# Patient Record
Sex: Female | Born: 1960 | Race: White | Hispanic: Yes | Smoking: Never smoker
Health system: Southern US, Community
[De-identification: ages and names within clinical notes are randomized; demographics above are authoritative.]

## PROBLEM LIST (undated history)

## (undated) DIAGNOSIS — I509 Heart failure, unspecified: Secondary | ICD-10-CM

## (undated) DIAGNOSIS — F419 Anxiety disorder, unspecified: Secondary | ICD-10-CM

## (undated) DIAGNOSIS — K8050 Calculus of bile duct without cholangitis or cholecystitis without obstruction: Secondary | ICD-10-CM

## (undated) DIAGNOSIS — I10 Essential (primary) hypertension: Secondary | ICD-10-CM

## (undated) HISTORY — PX: TUBAL LIGATION: SHX77

---

## 2006-07-22 ENCOUNTER — Encounter (INDEPENDENT_AMBULATORY_CARE_PROVIDER_SITE_OTHER): Payer: Self-pay | Admitting: Nurse Practitioner

## 2007-01-16 ENCOUNTER — Ambulatory Visit: Payer: Self-pay | Admitting: Family Medicine

## 2007-01-27 ENCOUNTER — Encounter: Payer: Self-pay | Admitting: Nurse Practitioner

## 2007-01-27 DIAGNOSIS — F329 Major depressive disorder, single episode, unspecified: Secondary | ICD-10-CM | POA: Insufficient documentation

## 2007-01-28 DIAGNOSIS — M1710 Unilateral primary osteoarthritis, unspecified knee: Secondary | ICD-10-CM | POA: Insufficient documentation

## 2007-01-28 DIAGNOSIS — IMO0002 Reserved for concepts with insufficient information to code with codable children: Secondary | ICD-10-CM

## 2007-01-28 DIAGNOSIS — G5600 Carpal tunnel syndrome, unspecified upper limb: Secondary | ICD-10-CM | POA: Insufficient documentation

## 2007-01-30 ENCOUNTER — Telehealth (INDEPENDENT_AMBULATORY_CARE_PROVIDER_SITE_OTHER): Payer: Self-pay | Admitting: *Deleted

## 2007-01-31 ENCOUNTER — Encounter (INDEPENDENT_AMBULATORY_CARE_PROVIDER_SITE_OTHER): Payer: Self-pay | Admitting: Nurse Practitioner

## 2007-01-31 ENCOUNTER — Ambulatory Visit: Payer: Self-pay | Admitting: Family Medicine

## 2007-01-31 LAB — CONVERTED CEMR LAB
ALT: 18 units/L (ref 0–35)
AST: 19 units/L (ref 0–37)
BUN: 18 mg/dL (ref 6–23)
Basophils Relative: 0 % (ref 0–1)
Chloride: 103 meq/L (ref 96–112)
Cholesterol: 195 mg/dL (ref 0–200)
Glucose, Bld: 74 mg/dL (ref 70–99)
HCT: 43.2 % (ref 36.0–46.0)
HDL: 37 mg/dL — ABNORMAL LOW (ref >39)
Hemoglobin: 14 g/dL (ref 12.0–15.0)
LDL Cholesterol: 123 mg/dL — ABNORMAL HIGH (ref 0–99)
Monocytes Absolute: 0.4 10*3/uL (ref 0.2–0.7)
Neutro Abs: 3.9 10*3/uL (ref 1.7–7.7)
Neutrophils Relative %: 58 % (ref 43–77)
Potassium: 4.2 meq/L (ref 3.5–5.3)
RBC: 4.84 M/uL (ref 3.87–5.11)
RDW: 13.4 % (ref 11.5–14.0)
Sodium: 138 meq/L (ref 135–145)
TSH: 3.627 microintl units/mL (ref 0.350–5.50)
Total Protein: 7.5 g/dL (ref 6.0–8.3)
Triglycerides: 176 mg/dL — ABNORMAL HIGH (ref <150)
WBC: 6.6 10*3/uL (ref 4.0–10.5)

## 2007-05-03 ENCOUNTER — Emergency Department (HOSPITAL_COMMUNITY): Admission: EM | Admit: 2007-05-03 | Discharge: 2007-05-03 | Payer: Self-pay | Admitting: *Deleted

## 2008-11-09 ENCOUNTER — Ambulatory Visit: Payer: Self-pay | Admitting: Internal Medicine

## 2008-11-09 DIAGNOSIS — R609 Edema, unspecified: Secondary | ICD-10-CM | POA: Insufficient documentation

## 2008-11-09 DIAGNOSIS — M549 Dorsalgia, unspecified: Secondary | ICD-10-CM | POA: Insufficient documentation

## 2008-11-09 DIAGNOSIS — M722 Plantar fascial fibromatosis: Secondary | ICD-10-CM | POA: Insufficient documentation

## 2008-11-09 DIAGNOSIS — I8390 Asymptomatic varicose veins of unspecified lower extremity: Secondary | ICD-10-CM | POA: Insufficient documentation

## 2008-11-09 LAB — CONVERTED CEMR LAB: CO2: 24 meq/L (ref 19–32)

## 2008-11-22 ENCOUNTER — Ambulatory Visit: Payer: Self-pay | Admitting: *Deleted

## 2008-11-25 ENCOUNTER — Encounter: Admission: RE | Admit: 2008-11-25 | Discharge: 2008-11-25 | Payer: Self-pay | Admitting: Internal Medicine

## 2008-11-26 ENCOUNTER — Encounter (INDEPENDENT_AMBULATORY_CARE_PROVIDER_SITE_OTHER): Payer: Self-pay | Admitting: Internal Medicine

## 2008-12-01 ENCOUNTER — Telehealth (INDEPENDENT_AMBULATORY_CARE_PROVIDER_SITE_OTHER): Payer: Self-pay | Admitting: Internal Medicine

## 2008-12-08 ENCOUNTER — Emergency Department (HOSPITAL_COMMUNITY): Admission: EM | Admit: 2008-12-08 | Discharge: 2008-12-08 | Payer: Self-pay | Admitting: Emergency Medicine

## 2008-12-14 ENCOUNTER — Ambulatory Visit: Payer: Self-pay | Admitting: Internal Medicine

## 2008-12-16 ENCOUNTER — Ambulatory Visit: Payer: Self-pay | Admitting: Internal Medicine

## 2008-12-16 ENCOUNTER — Encounter (INDEPENDENT_AMBULATORY_CARE_PROVIDER_SITE_OTHER): Payer: Self-pay | Admitting: Internal Medicine

## 2008-12-16 DIAGNOSIS — K625 Hemorrhage of anus and rectum: Secondary | ICD-10-CM | POA: Insufficient documentation

## 2008-12-16 LAB — CONVERTED CEMR LAB
Basophils Relative: 0 % (ref 0–1)
Bilirubin Urine: NEGATIVE
Blood in Urine, dipstick: NEGATIVE
Eosinophils Relative: 5 % (ref 0–5)
KOH Prep: NEGATIVE
Ketones, urine, test strip: NEGATIVE
MCHC: 33.6 g/dL (ref 30.0–36.0)
Neutro Abs: 4.2 10*3/uL (ref 1.7–7.7)
Neutrophils Relative %: 61 % (ref 43–77)
Nitrite: NEGATIVE
Platelets: 238 10*3/uL (ref 150–400)
Protein, U semiquant: NEGATIVE
RDW: 14.3 % (ref 11.5–15.5)
Specific Gravity, Urine: 1.02
T pallidum Antibodies (TP-PA): <0.1 (ref <1.0)
Urobilinogen, UA: 0.2
pH: 5

## 2008-12-22 ENCOUNTER — Ambulatory Visit: Payer: Self-pay | Admitting: Internal Medicine

## 2008-12-22 LAB — CONVERTED CEMR LAB
ALT: 21 units/L (ref 0–35)
AST: 21 units/L (ref 0–37)
Alkaline Phosphatase: 62 units/L (ref 39–117)
BUN: 21 mg/dL (ref 6–23)
Calcium: 8.9 mg/dL (ref 8.4–10.5)
Chloride: 106 meq/L (ref 96–112)
Cholesterol: 138 mg/dL (ref 0–200)
Glucose, Bld: 89 mg/dL (ref 70–99)
Potassium: 4.5 meq/L (ref 3.5–5.3)
Sodium: 140 meq/L (ref 135–145)
Total Bilirubin: 0.3 mg/dL (ref 0.3–1.2)
Total CHOL/HDL Ratio: 4.2
Total Protein: 6.6 g/dL (ref 6.0–8.3)
Triglycerides: 128 mg/dL (ref <150)

## 2009-01-01 ENCOUNTER — Encounter (INDEPENDENT_AMBULATORY_CARE_PROVIDER_SITE_OTHER): Payer: Self-pay | Admitting: Internal Medicine

## 2009-01-04 ENCOUNTER — Encounter: Admission: RE | Admit: 2009-01-04 | Discharge: 2009-02-15 | Payer: Self-pay | Admitting: Internal Medicine

## 2009-01-04 ENCOUNTER — Encounter (INDEPENDENT_AMBULATORY_CARE_PROVIDER_SITE_OTHER): Payer: Self-pay | Admitting: Internal Medicine

## 2009-01-06 ENCOUNTER — Encounter (INDEPENDENT_AMBULATORY_CARE_PROVIDER_SITE_OTHER): Payer: Self-pay | Admitting: Internal Medicine

## 2009-02-08 ENCOUNTER — Encounter (INDEPENDENT_AMBULATORY_CARE_PROVIDER_SITE_OTHER): Payer: Self-pay | Admitting: Internal Medicine

## 2009-08-08 ENCOUNTER — Emergency Department (HOSPITAL_COMMUNITY): Admission: EM | Admit: 2009-08-08 | Discharge: 2009-08-08 | Payer: Self-pay | Admitting: Family Medicine

## 2009-09-02 ENCOUNTER — Ambulatory Visit: Payer: Self-pay | Admitting: Internal Medicine

## 2009-09-02 DIAGNOSIS — R0789 Other chest pain: Secondary | ICD-10-CM | POA: Insufficient documentation

## 2009-09-02 LAB — CONVERTED CEMR LAB
BUN: 22 mg/dL (ref 6–23)
Basophils Absolute: 0 10*3/uL (ref 0.0–0.1)
Basophils Relative: 0 % (ref 0–1)
CO2: 24 meq/L (ref 19–32)
Calcium: 9.2 mg/dL (ref 8.4–10.5)
Cholesterol: 202 mg/dL — ABNORMAL HIGH (ref 0–200)
Eosinophils Absolute: 0.3 10*3/uL (ref 0.0–0.7)
Glucose, Bld: 92 mg/dL (ref 70–99)
HDL: 41 mg/dL (ref >39)
Hemoglobin: 14.6 g/dL (ref 12.0–15.0)
Lymphocytes Relative: 28 % (ref 12–46)
MCHC: 32.4 g/dL (ref 30.0–36.0)
MCV: 88.3 fL (ref 78.0–100.0)
Monocytes Absolute: 0.6 10*3/uL (ref 0.1–1.0)
Monocytes Relative: 9 % (ref 3–12)
Platelets: 253 10*3/uL (ref 150–400)
RBC: 5.11 M/uL (ref 3.87–5.11)
Total CHOL/HDL Ratio: 4.9
Triglycerides: 132 mg/dL (ref <150)

## 2009-09-09 ENCOUNTER — Telehealth (INDEPENDENT_AMBULATORY_CARE_PROVIDER_SITE_OTHER): Payer: Self-pay | Admitting: Internal Medicine

## 2009-09-16 ENCOUNTER — Encounter (INDEPENDENT_AMBULATORY_CARE_PROVIDER_SITE_OTHER): Payer: Self-pay | Admitting: Internal Medicine

## 2009-09-22 ENCOUNTER — Ambulatory Visit (HOSPITAL_COMMUNITY): Admission: RE | Admit: 2009-09-22 | Discharge: 2009-09-22 | Payer: Self-pay | Admitting: Interventional Cardiology

## 2009-09-22 ENCOUNTER — Encounter (INDEPENDENT_AMBULATORY_CARE_PROVIDER_SITE_OTHER): Payer: Self-pay | Admitting: Internal Medicine

## 2009-09-22 ENCOUNTER — Encounter (INDEPENDENT_AMBULATORY_CARE_PROVIDER_SITE_OTHER): Payer: Self-pay | Admitting: Interventional Cardiology

## 2009-10-06 ENCOUNTER — Encounter (HOSPITAL_COMMUNITY): Admission: RE | Admit: 2009-10-06 | Discharge: 2009-12-02 | Payer: Self-pay | Admitting: Interventional Cardiology

## 2009-10-06 ENCOUNTER — Encounter (INDEPENDENT_AMBULATORY_CARE_PROVIDER_SITE_OTHER): Payer: Self-pay | Admitting: Internal Medicine

## 2009-11-04 ENCOUNTER — Ambulatory Visit: Payer: Self-pay | Admitting: Internal Medicine

## 2009-11-15 ENCOUNTER — Ambulatory Visit (HOSPITAL_COMMUNITY): Admission: RE | Admit: 2009-11-15 | Discharge: 2009-11-15 | Payer: Self-pay | Admitting: Internal Medicine

## 2009-12-27 ENCOUNTER — Telehealth (INDEPENDENT_AMBULATORY_CARE_PROVIDER_SITE_OTHER): Payer: Self-pay | Admitting: Internal Medicine

## 2009-12-27 ENCOUNTER — Ambulatory Visit: Payer: Self-pay | Admitting: Internal Medicine

## 2009-12-27 DIAGNOSIS — E7800 Pure hypercholesterolemia, unspecified: Secondary | ICD-10-CM | POA: Insufficient documentation

## 2009-12-27 DIAGNOSIS — D485 Neoplasm of uncertain behavior of skin: Secondary | ICD-10-CM | POA: Insufficient documentation

## 2009-12-27 DIAGNOSIS — M25569 Pain in unspecified knee: Secondary | ICD-10-CM | POA: Insufficient documentation

## 2009-12-27 LAB — CONVERTED CEMR LAB
Bilirubin Urine: NEGATIVE
GC Probe Amp, Genital: NEGATIVE
Pap Smear: NEGATIVE
Urobilinogen, UA: 0.2
pH: 5

## 2009-12-28 ENCOUNTER — Encounter (INDEPENDENT_AMBULATORY_CARE_PROVIDER_SITE_OTHER): Payer: Self-pay | Admitting: Internal Medicine

## 2010-04-11 ENCOUNTER — Encounter
Admission: RE | Admit: 2010-04-11 | Discharge: 2010-04-11 | Payer: Self-pay | Source: Home / Self Care | Attending: Internal Medicine | Admitting: Internal Medicine

## 2010-04-11 ENCOUNTER — Encounter (INDEPENDENT_AMBULATORY_CARE_PROVIDER_SITE_OTHER): Payer: Self-pay | Admitting: Internal Medicine

## 2010-06-25 ENCOUNTER — Encounter: Payer: Self-pay | Admitting: Interventional Cardiology

## 2010-06-25 ENCOUNTER — Encounter: Payer: Self-pay | Admitting: Internal Medicine

## 2010-06-26 ENCOUNTER — Encounter: Payer: Self-pay | Admitting: Occupational Therapy

## 2010-07-04 NOTE — Progress Notes (Signed)
Summary: DERMATOLOGY APPT   Phone Note Outgoing Call   Summary of Call: Marcia Mccoy--referral to derm at [facility] and letter written. Initial call taken by: Julieanne Manson MD,  December 27, 2009 6:07 PM  Follow-up for Phone Call        THE NEXT AVAILABLE APPT 02-07-10 @ 5PM . PT AWARE OF THE APPT  Follow-up by: Cheryll Dessert,  December 28, 2009 10:28 AM

## 2010-07-04 NOTE — Letter (Signed)
Summary: *HSN Results Follow up  [facility]  [address] [place], [place] [address]   Phone: [phone]  Fax: [phone]      12/28/2009   [facility] [address] [place], [place]  [address]   Dear  Ms. Hopie Tuckerman,                            ____S.Drinkard,FNP   ____D. Gore,FNP       ____B. McPherson,MD   ____V. Rankins,MD    __X__E. Remi Rester,MD    ____N. Daphine Deutscher, FNP  ____D. Reche Dixon, MD    ____K. Philipp Deputy, MD    ____Other     This letter is to inform you that your recent test(s):  ____X___Pap Smear    ___X____Lab Test     _______X-ray    ____X___ is within acceptable limits  _______ requires a medication change  _______ requires a follow-up lab visit  _______ requires a follow-up visit with your provider   Comments:       _________________________________________________________ If you have any questions, please contact our office                     Sincerely,  Julieanne Manson MD [facility]

## 2010-07-04 NOTE — Letter (Signed)
Summary: NUTRITION & DIABETES MANAGEMENT  NUTRITION & DIABETES MANAGEMENT   Imported By: Arta Bruce 04/20/2010 10:54:59  _____________________________________________________________________  External Attachment:    Type:   Image     Comment:   External Document

## 2010-07-04 NOTE — Letter (Signed)
Summary: *Referral Letter  [facility]  [address] [place], [place] [address]   Phone: [phone]  Fax: [phone]    09/02/2009  Thank you in advance for agreeing to see my patient:  Marcia Mccoy [address] [place], [place]  [address]  Phone: [phone]  Reason for Referral: 50 yo female with atypical chest pain.  Mother died in early 22s of MI.  Pt. with obesity and dyslipidemia (low HDL only).  Will enclose Chest xray result, EKG, labs  Procedures Requested: ?stress testing  Current Medical Problems: 1)  CHEST PAIN, ATYPICAL (ICD-786.59) 2)  RECTAL BLEEDING (ICD-569.3) 3)  ROUTINE GYNECOLOGICAL EXAMINATION (ICD-V72.31) 4)  HEALTH MAINTENANCE EXAM (ICD-V70.0) 5)  MORBID OBESITY (ICD-278.01) 6)  PLANTAR FASCIITIS, BILATERAL (ICD-728.71) 7)  VARICOSE VEINS, LOWER EXTREMITIES (ICD-454.9) 8)  PERIPHERAL EDEMA (ICD-782.3) 9)  BACK PAIN, ACUTE (ICD-724.5) 10)  FAMILY HISTORY DIABETES 1ST DEGREE RELATIVE (ICD-V18.0) 11)  ARTHRITIS, KNEE (ICD-716.96) 12)  CARPAL TUNNEL SYNDROME (ICD-354.0) 13)  DEPRESSION (ICD-311)   Current Medications: 1)  NAPROXEN 500 MG TABS (NAPROXEN) 1 tab by mouth two times a day with food for 2 weeks, then as needed 2)  FUROSEMIDE 40 MG TABS (FUROSEMIDE) 1 tab by mouth daily in morning 3)  CALCIUM-VITAMIN D 600-200 MG-UNIT TABS (CALCIUM-VITAMIN D) 1 tab by mouth daily   Past Medical History: 1)  ROUTINE GYNECOLOGICAL EXAMINATION (ICD-V72.31) 2)  HEALTH MAINTENANCE EXAM (ICD-V70.0) 3)  MORBID OBESITY (ICD-278.01) 4)  PLANTAR FASCIITIS, BILATERAL (ICD-728.71) 5)  VARICOSE VEINS, LOWER EXTREMITIES (ICD-454.9) 6)  PERIPHERAL EDEMA (ICD-782.3) 7)  BACK PAIN, ACUTE (ICD-724.5) 8)  FAMILY HISTORY DIABETES 1ST DEGREE RELATIVE (ICD-V18.0) 9)  ARTHRITIS, KNEE (ICD-716.96) 10)  CARPAL TUNNEL SYNDROME (ICD-354.0) 11)  DEPRESSION (ICD-311)   Prior History of Blood Transfusions:   Pertinent Labs:    Thank you again for  agreeing to see our patient; please contact us if you have any further questions or need additional information.  Sincerely,  Julieanne Manson MD
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
## 2010-07-04 NOTE — Letter (Signed)
Summary: [facility]   Imported By: Arta Bruce 11/16/2009 12:22:00  _____________________________________________________________________  External Attachment:    Type:   Image     Comment:   External Document

## 2010-07-04 NOTE — Miscellaneous (Signed)
Summary: Rehab Report//DISCHARGE THERAPY  Rehab Report//DISCHARGE THERAPY   Imported By: Arta Bruce 08/01/2009 10:31:21  _____________________________________________________________________  External Attachment:    Type:   Image     Comment:   External Document

## 2010-07-04 NOTE — Letter (Signed)
Summary: *Referral Letter  [facility]  [address] [place], [place] [address]   Phone: [phone]  Fax: [phone]    12/27/2009  Thank you in advance for agreeing to see my patient:  Marcia Mccoy [address] [place], [place]  [address]  Phone: [phone]  Reason for Referral: Thickened, varaiably colored lesion above right shoulder blade.  Suspect a dermatofibroma, but would like a second opinion.  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) 2)  KNEE PAIN, LEFT (ICD-719.46) 3)  HYPERCHOLESTEROLEMIA, MILD (ICD-272.0) 4)  CHEST PAIN, ATYPICAL (ICD-786.59) 5)  RECTAL BLEEDING (ICD-569.3) 6)  ROUTINE GYNECOLOGICAL EXAMINATION (ICD-V72.31) 7)  HEALTH MAINTENANCE EXAM (ICD-V70.0) 8)  MORBID OBESITY (ICD-278.01) 9)  PLANTAR FASCIITIS, BILATERAL (ICD-728.71) 10)  VARICOSE VEINS, LOWER EXTREMITIES (ICD-454.9) 11)  PERIPHERAL EDEMA (ICD-782.3) 12)  BACK PAIN, ACUTE (ICD-724.5) 13)  FAMILY HISTORY DIABETES 1ST DEGREE RELATIVE (ICD-V18.0) 14)  ARTHRITIS, KNEE (ICD-716.96) 15)  CARPAL TUNNEL SYNDROME (ICD-354.0) 16)  DEPRESSION (ICD-311)   Current Medications: 1)  NAPROXEN 500 MG TABS (NAPROXEN) 1 tab by mouth two times a day with food for 2 weeks, then as needed 2)  FUROSEMIDE 40 MG TABS (FUROSEMIDE) 1 tab by mouth daily in morning 3)  CALCIUM-VITAMIN D 600-200 MG-UNIT TABS (CALCIUM-VITAMIN D) 1 tab by mouth daily   Past Medical History: 1)  HYPERCHOLESTEROLEMIA, MILD (ICD-272.0) 2)  CHEST PAIN, ATYPICAL (ICD-786.59) 3)  RECTAL BLEEDING (ICD-569.3) 4)  ROUTINE GYNECOLOGICAL EXAMINATION (ICD-V72.31) 5)  HEALTH MAINTENANCE EXAM (ICD-V70.0) 6)  MORBID OBESITY (ICD-278.01) 7)  PLANTAR FASCIITIS, BILATERAL (ICD-728.71) 8)  VARICOSE VEINS, LOWER EXTREMITIES (ICD-454.9) 9)  PERIPHERAL EDEMA (ICD-782.3) 10)  BACK PAIN, ACUTE (ICD-724.5) 11)  FAMILY  HISTORY DIABETES 1ST DEGREE RELATIVE (ICD-V18.0) 12)  ARTHRITIS, KNEE (ICD-716.96) 13)  CARPAL TUNNEL SYNDROME (ICD-354.0) 14)  DEPRESSION (ICD-311) 15)      Prior History of Blood Transfusions:   Pertinent Labs:    Thank you again for agreeing to see our patient; please contact us if you have any further questions or need additional information.  Sincerely,  Julieanne Manson MD

## 2010-07-04 NOTE — Progress Notes (Signed)
Summary: Refill request  Phone Note Call from Patient Call back at Home Phone [phone]   Summary of Call: The pt states that the provider supposltly needs to send a new prescription for her heart issue to the [facility]. (pt do not know the name of the medication only that is for heart issue) Isaih Bulger MD Initial call taken by: Manon Hilding,  September 09, 2009 3:49 PM  Follow-up for Phone Call        Will check to see if Dr. Delrae Alfred recalls which med this could be. Follow-up by: Vesta Mixer CMA,  September 09, 2009 4:23 PM  Additional Follow-up for Phone Call Additional follow up Details #1::        pt called via interpreter and wanted to know if meds had been called into pharmacy. she didnt know the name of the pill but she states it is for her heart. Per her description sounds like a nitrostat. Will forward to provider to review.... Additional Follow-up by: Mikey College CMA,  September 12, 2009 3:50 PM    Additional Follow-up for Phone Call Additional follow up Details #2::    It was Nitroglycerin--I have faxed--please let pt. know.  Sorry for the delay. Follow-up by: Julieanne Manson MD,  September 13, 2009 1:38 PM  Additional Follow-up for Phone Call Additional follow up Details #3:: Details for Additional Follow-up Action Taken: Pt aware. Additional Follow-up by: Vesta Mixer CMA,  September 13, 2009 5:27 PM

## 2010-07-04 NOTE — Progress Notes (Signed)
Summary: Office Visit//DEPRESSION SCREENING  Office Visit//DEPRESSION SCREENING   Imported By: Arta Bruce 12/28/2009 10:42:27  _____________________________________________________________________  External Attachment:    Type:   Image     Comment:   External Document

## 2010-07-04 NOTE — Letter (Signed)
Summary: TRANSTHORACIC ECHOCARDIOGRAPHY  TRANSTHORACIC ECHOCARDIOGRAPHY   Imported By: Arta Bruce 11/07/2009 09:25:57  _____________________________________________________________________  External Attachment:    Type:   Image     Comment:   External Document

## 2010-07-04 NOTE — Assessment & Plan Note (Signed)
Summary: FU CHEST PAIN AND CTS//GK   Vital Signs:  Patient profile:   50 year old female Weight:      218 pounds BMI:     42.38 Temp:     97.8 degrees F Pulse rate:   60 / minute Pulse rhythm:   regular Resp:     20 per minute BP sitting:   113 / 73  (left arm) Cuff size:   large  Vitals Entered By: Vesta Mixer CMA (November 04, 2009 10:32 AM) CC: f/u chest pain and cts, does feel like she is doing some better. Is Patient Diabetic? No Pain Assessment Patient in pain? no       Does patient need assistance? Ambulation Normal   CC:  f/u chest pain and cts and does feel like she is doing some better.Marland Kitchen  History of Present Illness: 1.  Chest Pain:  Has not had chest pain as before--very rare and does not last long.  Had cardiac work up with Dr. Garnette Scheuermann.  Sounds like had a cardiolite stress test that was normal--found later and was normal and echo, was also normal without regional wall motion abnormality and normal EF.    2.  Carpal Tunnel Syndrome.  Wearing cockup splints nightly and using Naproxen just intermittently.  Has noted symptoms only a couple of times since last seen.    3.  Breasts feeling heavy and sore, like when breast feeding or pregnant.  Has not had a period in 6 months.  Has not had a mammogram since age 89.  Cannot recall why did not go for mammogram last year after CPP.  Is having hot flashes.  Is also having heaviness and cramping like will have a period along with the breast soreness.  Both symptoms have been going on for 2 months.  Current Medications (verified): 1)  Naproxen 500 Mg Tabs (Naproxen) .Marland Kitchen.. 1 Tab By Mouth Two Times A Day With Food For 2 Weeks, Then As Needed 2)  Furosemide 40 Mg Tabs (Furosemide) .Marland Kitchen.. 1 Tab By Mouth Daily in Morning 3)  Calcium-Vitamin D 600-200 Mg-Unit Tabs (Calcium-Vitamin D) .Marland Kitchen.. 1 Tab By Mouth Daily 4)  Nitrostat 0.4 Mg Subl (Nitroglycerin) .Marland Kitchen.. 1 Tab Sublingual As Needed Chest Pain, May Repeat Every 5 Minutes X2 If Not  Relieved--Lie Down After Taking  Allergies (verified): No Known Drug Allergies  Physical Exam  Lungs:  Normal respiratory effort, chest expands symmetrically. Lungs are clear to auscultation, no crackles or wheezes. Heart:  Normal rate and regular rhythm. S1 and S2 normal without gallop, murmur, click, rub or other extra sounds.  Radial pulses normal and equal Neurologic:  Negative Tinel's over median nerve at wrists.   Impression & Recommendations:  Problem # 1:  CHEST PAIN, ATYPICAL (ICD-786.59) Non cardiac.  Problem # 2:  CARPAL TUNNEL SYNDROME (ICD-354.0) Controlled with splinting and intermittent NSAIDS  Problem # 3:  BREAST TENDERNESS (ICD-611.71) Suspect her estrogen levels are waxing and waning as she goes through menopause. Needs Mammogram regardless and will set up for CPP in July when due--scheduled  Complete Medication List: 1)  Naproxen 500 Mg Tabs (Naproxen) .Marland Kitchen.. 1 tab by mouth two times a day with food for 2 weeks, then as needed 2)  Furosemide 40 Mg Tabs (Furosemide) .Marland Kitchen.. 1 tab by mouth daily in morning 3)  Calcium-vitamin D 600-200 Mg-unit Tabs (Calcium-vitamin d) .Marland Kitchen.. 1 tab by mouth daily 4)  Nitrostat 0.4 Mg Subl (Nitroglycerin) .Marland Kitchen.. 1 tab sublingual as needed chest pain, may  repeat every 5 minutes x2 if not relieved--lie down after taking  Patient Instructions: 1)  Schedule CPP with Tayten Heber in July or August--needs to be after 7/15

## 2010-07-04 NOTE — Assessment & Plan Note (Signed)
Summary: numbness arms/ gk   Vital Signs:  Patient profile:   50 year old female Weight:      215 pounds Temp:     97.9 degrees F Pulse rate:   61 / minute Pulse rhythm:   regular Resp:     20 per minute BP sitting:   103 / 70  (right arm) Cuff size:   large  Vitals Entered By: Vesta Mixer CMA (September 02, 2009 12:11 PM) CC: Numbness in both arms for around one month.  No longer taking calcium Is Patient Diabetic? No Pain Assessment Patient in pain? no       Does patient need assistance? Ambulation Normal   CC:  Numbness in both arms for around one month.  No longer taking calcium.  History of Present Illness: 1.  Bilateral hand numbness--also ants crawling up thumb side fingers--pain can radiate all the way up to shoulders.  Not using cock up splints.  Does use Naproxen intermittently.  2.  Left Chest discomfort:  Has had intermittently for one month.  Feels like pressure or something poking at chest.  No radiation.  Develops dyspnea with this.  Can happen at rest or exertion.  When is emotional about something has the pain most significantly.  Has had 3-4 times.  Lasts only seconds.  Maybe occasional palpitations--either fast or slow with this.  Mother with history of CAD, pt. without hyperlipdemia,  DM, hypertension.  She is obese, however.  Habits & Providers  Alcohol-Tobacco-Diet     Alcohol drinks/day: 0     Tobacco Status: limited to occasional cigarette when a teenager.  Exercise-Depression-Behavior     Drug Use: never  Current Medications (verified): 1)  Naproxen 500 Mg Tabs (Naproxen) .Marland Kitchen.. 1 Tab By Mouth Two Times A Day With Food For 2 Weeks, Then As Needed 2)  Furosemide 40 Mg Tabs (Furosemide) .Marland Kitchen.. 1 Tab By Mouth Daily in Morning 3)  Calcium-Vitamin D 600-200 Mg-Unit Tabs (Calcium-Vitamin D) .Marland Kitchen.. 1 Tab By Mouth Daily  Allergies (verified): No Known Drug Allergies  Family History: Reviewed history from 12/16/2008 and no changes required. Mother, died  43:  CAD, DM with kidney failure--ultimately decided to stop dialysis, hypertension Father, 16:  Healthy 3 Brothers:  Healthy 4 Sisters:  Healthy 7 children:  93 yo daughter with DM, obesity  Social History: Smoking Status:  limited to occasional cigarette when a teenager. Drug Use:  never  Physical Exam  General:  Morbidly obese. Neck:  Unable to see neck veins well. Chest Wall:  Mild tenderness over sternum--does not reproduce the same pain Lungs:  Normal respiratory effort, chest expands symmetrically. Lungs are clear to auscultation, no crackles or wheezes. Heart:  Normal rate and regular rhythm. S1 and S2 normal without gallop, murmur, click, rub or other extra sounds.  Radial pulses normal and equal Abdomen:  soft and non-tender.   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 weight and family history will send to cardiology Orders: T-Lipid Profile ([id number]) T-Basic Metabolic Panel ([id number]) T-CBC w/Diff ([id number]) EKG w/ Interpretation (93000) Diagnostic X-Ray/Fluoroscopy (Diagnostic X-Ray/Flu) Cardiology Referral (Cardiology)  Problem # 2:  CARPAL TUNNEL SYNDROME (ICD-354.0) Needs to wear cock up splints nightly and take Naproxen two times a day --the latter for next 2 weeks and then as needed   Complete Medication List: 1)  Naproxen 500 Mg Tabs (Naproxen) .Marland Kitchen.. 1 tab by mouth two times a day with food for 2 weeks, then as needed 2)  Furosemide 40 Mg Tabs (Furosemide) .Marland Kitchen.. 1 tab by mouth daily in morning 3)  Calcium-vitamin D 600-200 Mg-unit Tabs (Calcium-vitamin d) .Marland Kitchen.. 1 tab by mouth daily 4)  Nitrostat 0.4 Mg Subl (Nitroglycerin) .Marland Kitchen.. 1 tab sublingual as needed chest pain, may repeat every 5 minutes x2 if not relieved--lie down after taking  Patient  Instructions: 1)  Follow up with Dr. Delrae Alfred in 2 months --Chest pain and CTS Prescriptions: FUROSEMIDE 40 MG TABS (FUROSEMIDE) 1 tab by mouth daily in morning  #30 x 11   Entered and Authorized by:   Julieanne Manson MD   Signed by:   Julieanne Manson MD on 09/13/2009   Method used:   Faxed to ...       [facility] (retail)       [address] [place], [place]  [address]       Ph: [phone]       Fax: [phone]   RxID:   [id number] NITROSTAT 0.4 MG SUBL (NITROGLYCERIN) 1 tab sublingual as needed chest pain, may repeat every 5 minutes X2 if not relieved--lie down after taking  #24 x 0   Entered and Authorized by:   Julieanne Manson MD   Signed by:   Julieanne Manson MD on 09/13/2009   Method used:   Faxed to ...       [facility] (retail)       [address] [place], [place]  [address]       Ph: [phone]       Fax: [phone]   RxID:   [id number]

## 2010-07-06 NOTE — Letter (Signed)
Summary: PSYCHOLOGY REFERRAL/ NO SHOWED  PSYCHOLOGY REFERRAL/ NO SHOWED   Imported By: Arta Bruce 06/12/2010 10:02:10  _____________________________________________________________________  External Attachment:    Type:   Image     Comment:   External Document

## 2010-07-11 ENCOUNTER — Encounter (INDEPENDENT_AMBULATORY_CARE_PROVIDER_SITE_OTHER): Payer: Self-pay | Admitting: Internal Medicine

## 2010-07-16 ENCOUNTER — Inpatient Hospital Stay (INDEPENDENT_AMBULATORY_CARE_PROVIDER_SITE_OTHER)
Admission: RE | Admit: 2010-07-16 | Discharge: 2010-07-16 | Disposition: A | Discharge status: Home / Self Care | Payer: Self-pay | Source: Ambulatory Visit | Attending: Emergency Medicine | Admitting: Emergency Medicine

## 2010-07-16 ENCOUNTER — Emergency Department (HOSPITAL_COMMUNITY): Payer: Self-pay

## 2010-07-16 ENCOUNTER — Emergency Department (HOSPITAL_COMMUNITY)
Admission: EM | Admit: 2010-07-16 | Discharge: 2010-07-16 | Disposition: A | Discharge status: Home / Self Care | Payer: Self-pay | Attending: Emergency Medicine | Admitting: Emergency Medicine

## 2010-07-16 DIAGNOSIS — R10812 Left upper quadrant abdominal tenderness: Secondary | ICD-10-CM | POA: Insufficient documentation

## 2010-07-16 DIAGNOSIS — R109 Unspecified abdominal pain: Secondary | ICD-10-CM

## 2010-07-16 DIAGNOSIS — R110 Nausea: Secondary | ICD-10-CM | POA: Insufficient documentation

## 2010-07-16 LAB — HEPATIC FUNCTION PANEL: Albumin: 3.8 g/dL (ref 3.5–5.2)

## 2010-07-16 LAB — CBC
HCT: 40.7 % (ref 36.0–46.0)
Hemoglobin: 14.2 g/dL (ref 12.0–15.0)
MCV: 86.2 fL (ref 78.0–100.0)
Platelets: 236 10*3/uL (ref 150–400)
RBC: 4.72 MIL/uL (ref 3.87–5.11)

## 2010-07-16 LAB — DIFFERENTIAL
Basophils Absolute: 0 10*3/uL (ref 0.0–0.1)
Lymphs Abs: 2.4 10*3/uL (ref 0.7–4.0)
Monocytes Absolute: 0.7 10*3/uL (ref 0.1–1.0)
Neutro Abs: 5.2 10*3/uL (ref 1.7–7.7)

## 2010-07-16 LAB — POCT URINALYSIS DIPSTICK
Bilirubin Urine: NEGATIVE
Hgb urine dipstick: NEGATIVE
Protein, ur: NEGATIVE mg/dL
Urine Glucose, Fasting: NEGATIVE mg/dL
Urobilinogen, UA: 0.2 mg/dL (ref 0.0–1.0)
pH: 6.5 (ref 5.0–8.0)

## 2010-07-16 LAB — POCT PREGNANCY, URINE: Preg Test, Ur: NEGATIVE

## 2010-07-31 ENCOUNTER — Encounter (INDEPENDENT_AMBULATORY_CARE_PROVIDER_SITE_OTHER): Payer: Self-pay | Admitting: Internal Medicine

## 2010-08-01 ENCOUNTER — Other Ambulatory Visit: Payer: Self-pay | Admitting: Internal Medicine

## 2010-08-01 ENCOUNTER — Encounter (INDEPENDENT_AMBULATORY_CARE_PROVIDER_SITE_OTHER): Payer: Self-pay | Admitting: Internal Medicine

## 2010-08-01 ENCOUNTER — Encounter: Payer: Self-pay | Admitting: Internal Medicine

## 2010-08-01 DIAGNOSIS — N644 Mastodynia: Secondary | ICD-10-CM | POA: Insufficient documentation

## 2010-08-01 DIAGNOSIS — R109 Unspecified abdominal pain: Secondary | ICD-10-CM | POA: Insufficient documentation

## 2010-08-07 ENCOUNTER — Ambulatory Visit
Admission: RE | Admit: 2010-08-07 | Discharge: 2010-08-07 | Disposition: A | Discharge status: Home / Self Care | Payer: Self-pay | Source: Ambulatory Visit | Attending: Internal Medicine | Admitting: Internal Medicine

## 2010-08-07 DIAGNOSIS — N644 Mastodynia: Secondary | ICD-10-CM

## 2010-08-10 NOTE — Letter (Signed)
Summary: [facility]  [facility]   Imported By: Arta Bruce 08/04/2010 14:38:51  _____________________________________________________________________  External Attachment:    Type:   Image     Comment:   External Document

## 2010-08-10 NOTE — Miscellaneous (Signed)
Summary: Derm record review.  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

## 2010-08-10 NOTE — Assessment & Plan Note (Signed)
Summary: Lump on left breast and left ovary pain   Vital Signs:  Patient profile:   50 year old female Menstrual status:  irregular LMP:     06/10/2010 Weight:      221.19 pounds Temp:     98.1 degrees F oral Pulse rate:   69 / minute Pulse rhythm:   regular Resp:     22 per minute BP sitting:   140 / 77  (left arm) Cuff size:   regular  Vitals Entered By: Hale Drone CMA (August 01, 2010 3:11 PM) CC: Lump on left breast x1 month. When she first noticed it- there was some pain associated but since then has not felt any pain. Also having pain on left ovary x2 weeks. Pain has gotten better since then.  Is Patient Diabetic? No Pain Assessment Patient in pain? no       Does patient need assistance? Functional Status Self care Ambulation Normal LMP (date): 06/10/2010     Menstrual Status irregular Enter LMP: 06/10/2010 Last PAP Result NEGATIVE FOR INTRAEPITHELIAL LESIONS OR MALIGNANCY.   CC:  Lump on left breast x1 month. When she first noticed it- there was some pain associated but since then has not felt any pain. Also having pain on left ovary x2 weeks. Pain has gotten better since then. .  History of Present Illness: 1.  Left breast mass:  as above.  Still feels the lump, but no longer painful.  No nipple discharge.  No pain in axilla  2. Left pelvic pain for 2 weeks.  Is skipping periods--has noted this discomfort before she starts a period again. Has noted that hot flashes recently not so significant.  No change with BM, urination.  Not currently having the pain.  Current Medications (verified): 1)  Naproxen 500 Mg Tabs (Naproxen) .Marland Kitchen.. 1 Tab By Mouth Two Times A Day With Food For 2 Weeks, Then As Needed 2)  Furosemide 40 Mg Tabs (Furosemide) .Marland Kitchen.. 1 Tab By Mouth Daily in Morning 3)  Calcium-Vitamin D 600-200 Mg-Unit Tabs (Calcium-Vitamin D) .Marland Kitchen.. 1 Tab By Mouth Daily  Allergies (verified): No Known Drug Allergies  Physical Exam  General:  NAD Breasts:  Tender in  left lower medial breast, but no palpable solitary mass, just some general lumpiness.  No nipple discharge or axillary adenopathy.  No erythema.  Right breast normal. Abdomen:  Bowel sounds positive,abdomen soft and non-tender without masses, organomegaly or hernias noted.   Impression & Recommendations:  Problem # 1:  BREAST PAIN, LEFT (ICD-611.71)  Orders: Mammogram (Diagnostic) (Mammo)  Problem # 2:  PELVIC PAIN, LEFT (ICD-789.09) Suspect she is getting ready to have flow-to call if does not go away with flow Her updated medication list for this problem includes:    Naproxen 500 Mg Tabs (Naproxen) .Marland Kitchen... 1 tab by mouth two times a day with food for 2 weeks, then as needed  Complete Medication List: 1)  Naproxen 500 Mg Tabs (Naproxen) .Marland Kitchen.. 1 tab by mouth two times a day with food for 2 weeks, then as needed 2)  Furosemide 40 Mg Tabs (Furosemide) .Marland Kitchen.. 1 tab by mouth daily in morning 3)  Calcium-vitamin D 600-200 Mg-unit Tabs (Calcium-vitamin d) .Marland Kitchen.. 1 tab by mouth daily   Orders Added: 1)  Mammogram (Diagnostic) [Mammo] 2)  Est. Patient Level III [81191]

## 2011-01-04 ENCOUNTER — Ambulatory Visit: Payer: Self-pay | Attending: Family Medicine | Admitting: Physical Therapy

## 2011-01-04 DIAGNOSIS — M25569 Pain in unspecified knee: Secondary | ICD-10-CM | POA: Insufficient documentation

## 2011-01-04 DIAGNOSIS — M6281 Muscle weakness (generalized): Secondary | ICD-10-CM | POA: Insufficient documentation

## 2011-01-04 DIAGNOSIS — R262 Difficulty in walking, not elsewhere classified: Secondary | ICD-10-CM | POA: Insufficient documentation

## 2011-01-04 DIAGNOSIS — IMO0001 Reserved for inherently not codable concepts without codable children: Secondary | ICD-10-CM | POA: Insufficient documentation

## 2011-01-04 DIAGNOSIS — M25669 Stiffness of unspecified knee, not elsewhere classified: Secondary | ICD-10-CM | POA: Insufficient documentation

## 2011-01-15 ENCOUNTER — Ambulatory Visit: Payer: Self-pay | Admitting: Physical Therapy

## 2011-01-17 ENCOUNTER — Ambulatory Visit: Payer: Self-pay | Admitting: Physical Therapy

## 2011-01-23 ENCOUNTER — Ambulatory Visit: Payer: Self-pay | Admitting: Physical Therapy

## 2011-01-26 ENCOUNTER — Ambulatory Visit: Payer: Self-pay | Admitting: Physical Therapy

## 2011-01-30 ENCOUNTER — Ambulatory Visit: Payer: Self-pay | Admitting: Physical Therapy

## 2011-01-31 ENCOUNTER — Encounter: Payer: Self-pay | Admitting: Physical Therapy

## 2011-02-02 ENCOUNTER — Ambulatory Visit: Payer: Self-pay | Admitting: Physical Therapy

## 2011-02-13 ENCOUNTER — Ambulatory Visit: Payer: Self-pay | Attending: Family Medicine | Admitting: Physical Therapy

## 2011-02-13 DIAGNOSIS — M25569 Pain in unspecified knee: Secondary | ICD-10-CM | POA: Insufficient documentation

## 2011-02-13 DIAGNOSIS — M25669 Stiffness of unspecified knee, not elsewhere classified: Secondary | ICD-10-CM | POA: Insufficient documentation

## 2011-02-13 DIAGNOSIS — IMO0001 Reserved for inherently not codable concepts without codable children: Secondary | ICD-10-CM | POA: Insufficient documentation

## 2011-02-13 DIAGNOSIS — R262 Difficulty in walking, not elsewhere classified: Secondary | ICD-10-CM | POA: Insufficient documentation

## 2011-02-13 DIAGNOSIS — M6281 Muscle weakness (generalized): Secondary | ICD-10-CM | POA: Insufficient documentation

## 2011-02-14 ENCOUNTER — Encounter: Payer: Self-pay | Admitting: Physical Therapy

## 2011-02-20 ENCOUNTER — Encounter: Payer: Self-pay | Admitting: Physical Therapy

## 2011-02-21 ENCOUNTER — Encounter: Payer: Self-pay | Admitting: Physical Therapy

## 2011-03-13 LAB — URINALYSIS, ROUTINE W REFLEX MICROSCOPIC
Bilirubin Urine: NEGATIVE
Glucose, UA: NEGATIVE
Hgb urine dipstick: NEGATIVE
Ketones, ur: NEGATIVE
Nitrite: NEGATIVE
Specific Gravity, Urine: 1.011

## 2013-12-15 ENCOUNTER — Emergency Department (HOSPITAL_COMMUNITY): Payer: Self-pay

## 2013-12-15 ENCOUNTER — Emergency Department (HOSPITAL_COMMUNITY)
Admission: EM | Admit: 2013-12-15 | Discharge: 2013-12-15 | Disposition: A | Discharge status: Home / Self Care | Payer: Self-pay | Attending: Emergency Medicine | Admitting: Emergency Medicine

## 2013-12-15 ENCOUNTER — Emergency Department (HOSPITAL_COMMUNITY)
Admission: EM | Admit: 2013-12-15 | Discharge: 2013-12-15 | Discharge status: Absent without leave | Payer: Self-pay | Source: Home / Self Care

## 2013-12-15 ENCOUNTER — Encounter (HOSPITAL_COMMUNITY): Payer: Self-pay | Admitting: Emergency Medicine

## 2013-12-15 DIAGNOSIS — M25569 Pain in unspecified knee: Secondary | ICD-10-CM | POA: Insufficient documentation

## 2013-12-15 DIAGNOSIS — M25562 Pain in left knee: Secondary | ICD-10-CM

## 2013-12-15 DIAGNOSIS — I509 Heart failure, unspecified: Secondary | ICD-10-CM | POA: Insufficient documentation

## 2013-12-15 DIAGNOSIS — Z79899 Other long term (current) drug therapy: Secondary | ICD-10-CM | POA: Insufficient documentation

## 2013-12-15 DIAGNOSIS — I10 Essential (primary) hypertension: Secondary | ICD-10-CM | POA: Insufficient documentation

## 2013-12-15 DIAGNOSIS — E669 Obesity, unspecified: Secondary | ICD-10-CM | POA: Insufficient documentation

## 2013-12-15 HISTORY — DX: Heart failure, unspecified: I50.9

## 2013-12-15 HISTORY — DX: Essential (primary) hypertension: I10

## 2013-12-15 MED ORDER — DICLOFENAC SODIUM 1 % TD GEL: 4.0000 g | Freq: Four times a day (QID) | TRANSDERMAL | Status: DC

## 2013-12-15 MED ORDER — OXYCODONE-ACETAMINOPHEN 5-325 MG PO TABS
1.0000 | ORAL_TABLET | Freq: Once | ORAL | Status: AC
  Administered 2013-12-15: 1 via ORAL
  Filled 2013-12-15: qty 1

## 2013-12-15 MED ORDER — NAPROXEN 500 MG PO TABS: 500.0000 mg | ORAL_TABLET | Freq: Two times a day (BID) | ORAL | Status: DC | PRN

## 2013-12-15 MED ORDER — OXYCODONE-ACETAMINOPHEN 5-325 MG PO TABS
1.0000 | ORAL_TABLET | Freq: Four times a day (QID) | ORAL | Status: DC | PRN
Start: 2013-12-15 — End: 2014-01-29

## 2013-12-15 NOTE — Discharge Instructions (Signed)
Wear knee sling for comfort, and Use crutches as needed for comfort. Ice and elevate knee throughout the day. Alternate between naprosyn and percocet for pain relief. Do not drive or operate machinery with pain medication use. Use voltaren gel on the knee as directed. Call orthopedic follow up today or tomorrow to schedule followup appointment for recheck of ongoing knee pain in one to two weeks that can be canceled with a 24-48 hour notice if complete resolution of pain. Return to the emergency room if any changes or worsening of symptoms occurs.   Dolor en la rodilla (Knee Pain) La rodilla es la articulacin compleja entre el muslo y la parte inferior de la pierna. En esta articulacin hay huesos, tendones, ligamentos y Database administrator. Los huesos que forman la rodilla son:  El fmur en el muslo.  La tibia y el peron en la pierna.  La rtula montada en la ranura de la parte inferior del muslo. [place] es una causa frecuente de [place] y puede tener varias causas. Algunas son:  Lesiones como:  Ruptura de ligamento o lesin en el tendn.  Esguince del cartlago  Enfermedades como:  Gota.  Artritis.  Infecciones.  Uso excesivo, demasiado entrenamiento o mucha actividad fsica. El dolor de rodilla puede ser leve o intenso. Puede acompaar una lesin debilitante. Los problemas leves con frecuencia responden bien a tratamientos caseros o se mejoran por s mismas. Las lesiones ms graves pueden requerir la intervencin del mdico y Rennis Petty. SNTOMAS La rodilla es una articulacin compleja. Los sntomas pueden variar ampliamente Algunos son:  Dolor con el movimiento o al soportar peso.  Hinchazn y Social research officer, government.  Torsin de la rodilla.  Imposibilidad para estirar la rodilla.  La rodilla se traba y no puede enderezarla.  Siente calor y se observa enrojecimiento con dolor y [place].  Deformidad o dislocacin de la rtula. DIAGNSTICO Determinar cual es el  problema puede ser bastante simple, como cuando hay una lesin. Tambin puede ser [employer] debido a la complejidad de la rodilla. Las pruebas para Optometrist un diagnstico son:  Shirleen Schirmer y examen fsico por parte del mdico.  Radiografas para descartar otros problemas. Las radiografas no mostrarn la ruptura del Database administrator. Algunas lesiones en la rodilla pueden diagnosticarse del siguiente modo:  La artroscopia es una tcnica quirrgica por la que una pequea cmara de vdeo se inserta en pequeas incisiones que se hacen a los lados de la rodilla. Este procedimiento se [place] para examinar y Psychiatric nurse los problemas de la articulacin interna de la rodilla. Se utilizan pequeos instrumentos para reparar el cartlago roto (meniscos).  La artrografa es una tcnica radiolgica. Se inyecta un lquido de contraste en la articulacin de la rodilla. Las estructuras internas de la articulacin de la rodilla se hacen visibles en una pelcula de rayos X.  Las imgenes por resonancia magntica son un procedimiento en el que los campos magnticos y una computadora producen imgenes en dos o tres dimensiones del interior de la rodilla. La ruptura del cartlago es visible con esta tcnica. La resonancia magntica ha reemplazado a la artrografa en el diagnstico de la ruptura del cartlago de la rodilla.  Anlisis de [place].  Examen del lquido que lubrica la articulacin de la rodilla (lquido sinovial). Se realiza tomando [place] con [place]. TRATAMIENTO El tratamiento de los problemas de la rodilla depende fundamentalmente de la causa. Algunos de estos tratamientos son:  Segn sea la lesin, un yeso o entablillado, ciruga o fisioterapia.  Permtase el tiempo adecuado de recuperacin. No use demasiado su extremidad lesionada. Si siente dolor durante los ejercicios de rutina, suspndalos. Hgalos ms lentos o realice menos repeticiones.  En el caso de actividades repetitivas como  andar en bicicleta o correr, mantenga la fuerza y Ardelia Mems buena nutricin.  Alterne los grupos musculares. Por ejemplo, si levanta pesas, trabaje la parte superior del cuerpo Optician, dispensing, y la parte inferior al da siguiente.  Ni los msculos firmes ni los dbiles proporcionan un sostn adecuado a la rodilla. Los msculos no absorben el estrs que se ejerce sobre la articulacin de la rodilla. Mantenga fuertes los msculos que rodean a la rodilla.  Cudese de los problemas mecnicos:  Si tiene pie plano, los zapatos ortopdicos o especiales pueden ayudar. Comunquese con el profesional que lo asiste si necesita ayuda adicional.  Los soporte de arco con bordes en la zona interna o interna del taco pueden ayudar. Cambian la presin del lado de la rodilla ms comprometido por la osteoartritis.  Podrn colocarle una ortesis de rodilla para aliviar la presin en la zona ms artrtica de la rodilla.  Si el profesional le ha prescripto muletas, ortesis, un vendaje o hielo, hgalo segn las indicaciones. El acrnimo para este tratamiento es PRICE. Significa proteccin, reposo, hielo, compresin y elevacin.  Los antiinflamatorios no esteroides, pueden ayudar a Best boy. Pero si se toman inmediatamente luego de la lesin, podran aumentar la hinchazn. Tome los corticoides luego de Soil scientist. Suspndalos si tiene problemas estomacales. No los tome si tiene una historia de Aeronautical engineer, Social research officer, government en el estmago o hemorragia intestinal. No lo tome sin la aprobacin del profesional que la asiste si tiene problemas de retencin de lquidos, insuficencia cardaca o problemas renales.  En los casos crnicos, la fisioterapia puede ser de [place].  La glucosamina y el condroitin son suplementos dietarios de Radio broadcast assistant. Ambos pueden Best boy de la osteoartritis de la rodilla. Estos medicamentos son diferentes de los antiinflamatorios habituales. La glucosamina puede disminuir el porcentaje de destruccin del  cartlago.  Las inyecciones de corticoides en la articulacin de la rodilla reducen los sntomas de un brote de artritis. Ofrecen alivio que dura algunos meses. Hay que esperar algunos meses entre la aplicacin de inyecciones. Las inyecciones tiene un pequeo riesgo de infeccin, retencin de lquidos y Agricultural consultant de los niveles de Museum/gallery exhibitions officer.  El cido hialurnico inyectado en las articulaciones lesionadas puede aliviar el dolor y proporciona lubricacin. Estas inyecciones funcionan bien reduciendo la inflamacin. Una serie de inyecciones puede proporcionar alivio durante seis meses.  [place]. Aplicar ciertos ungentos sobre la piel puede ayudar a Best boy y la rigidez de la osteoartritis. Consulte con el farmacutico, si es necesario. Muchos medicamentos de venta libre estn aprobados para el alivio temporario del dolor artrtico.  En algunos pases los mdicos prescriben antiinflamatorios no esteroides para el alivio de los trastornos crnicos como la artritis y la tendinitis. Un estudio del tratamiento con antiinflamatorios no esteroides aplicados en crema, demostr que funcionaban bien, as como administrados por va oral, pero sin el peligro de los [place]. PREVENCIN  Mantenga un peso normal. Los kilos de ms agregan tensin a las articulaciones.  Mantngase fuerte y gil. Los msculos dbiles son Ardelia Mems causa frecuente de lesiones en la rodilla. La elongacin es importante. Incluya ejercicios de flexibilidad en sus rutinas.  Practique actividad fsica con inteligencia. Si sufre osteoartritis, dolor crnico en la rodilla o lesiones recurrentes, podr ser necesario que modifique el modo en  que se ejercita. No significa que deba volverse inactivo. Si le duelen las rodillas despus de correr o jugar basketball, considere la prctica de la natacin, ejercicios aerbicos en el agua u otras actividades de bajo [place], al menos durante algunos das o [employer]. En  algunos casos, el [employer]ogg actividades de alto impacto ofrece [place].  Asegrese que sus zapatos le [place]. Elija el calzado deportivo adecuado para su deporte.  Proteja sus rodillas. Use la proteccin adecuada para las actividades que puedan afectar a sus rodillas. Use rodilleras cuando juegue al vley o se arrodille. Colquese el cinturn de seguridad cada vez que conduzca. La mayor parte de las fracturas de rtula ocurren en accidentes automvilsticos.  Descanse cuando se sienta cansado. SOLICITE ATENCIN MDICA SI: Tiene dolor en la rodilla que es continuo y no parece mejorar.  SOLICITE ATENCIN MDICA DE INMEDIATO SI:  La articulacin de la rodilla se siente caliente al tacto y usted tiene fiebre. EST SEGURO QUE:   Comprende las instrucciones para el alta mdica.  Controlar su enfermedad.  Solicitar atencin mdica de inmediato segn las indicaciones. Document Released: 11/07/2007 Document Revised: 08/13/2011 [facility] Patient Information 2015 [place]. This information is not intended to replace advice given to you by your health care provider. Make sure you discuss any questions you have with your health care provider.  Terapia con calor  (Heat Therapy)  La terapia con calor puede ayudar a aliviar los msculos y las articulaciones doloridos, tensos y rgidos. El calor no debe aplicarse en lesiones nuevas. Espere por lo menos a que pasen 48 horas despus de una lesin antes de emplear la terapia con calor. El calor tampoco debe usarse para calmar las molestias o el dolor que se produce inmediatamente despus de realizar [place]. Si an Tree surgeon o rigidez 3 horas despus de Teaching laboratory technician actividad, Chiropractor. PRECAUCIONES  Una temperatura muy alta o una exposicin prolongada al calor puede causar quemaduras. Sea cauto con la terapia de calor para evitar quemar la piel. Si usted tiene [employer], nouse calor hasta que  haya hablado con su mdico:   Mala circulacin.  Heridas que se estn curando o piel con cicatrices en la zona en la que va a tratar.  Diabetes, enfermedades cardacas o hipertensin arterial.  Entumecimiento de la zona a tratar.  Hinchazn inusual de la zona a tratar.  Infecciones activas.  Cogulos sanguneos.  Cncer.  Incapacidad para comunicar la respuesta al [employer]. Se incluye a los nios pequeos y las personas con demencia. INSTRUCCIONES PARA EL CUIDADO EN EL HOGAR  Compresas hmedas calientes   Remoje una toalla limpia en agua caliente y extraiga el agua extra. La temperatura del agua debe ser confortable para la la piel.  Ponga la toalla tibia y hmeda en una bolsa de plstico.  Coloque una toalla delgada y seca entre la piel y la bolsa.  Aplique la bolsa con calor en la zona durante 5 minutos y controle su piel. La piel puede estar de color rosa pero no debe estar roja.  Deje la almohadilla trmica en el rea durante 20 a 30 minutos.  Repita esto cada 2 a 4 horas mientras est despierto. No utilice calor mientras usted est durmiendo. Bao de agua caliente   Llene una baera con agua tibia. La temperatura del agua debe ser confortable para la la piel.  Coloque la parte afectada del cuerpo en la baera.  Remoje la zona durante 20 a 40  minutos.  Repita siempre que lo necesite. Bolsa de agua caliente   [employer] la bolsa hasta la mitad con agua caliente.  Extraiga el exceso de [place]. Cierre la tapa de [place] que [place].  Colquese una toalla entre la piel y Therapist, nutritional.  Coloque la bolsa sobre la zona durante 5 minutos y controle su piel. La piel puede estar de color rosa pero no debe estar roja.  Deje la bolsa en el rea durante 15 a 30 minutos.  Repita esto cada 2 a 4 horas mientras est despierto. Almohadilla trmica   Colquese una toalla entre la piel y la almohadilla trmica.  Ajuste la almohadilla en temperatura baja.  Colquela sobre la  zona durante 10 minutos y controle su piel. La piel puede estar de color rosa pero no debe estar roja.  Deje la almohadilla trmica en el rea durante 20 a 40 minutos.  Repita esto cada 2 a 4 horas mientras est despierto.  No se acueste sobre la almohadilla trmica.  No se duerma mientras la est usando.  No debe usarla cuando se encuentre cerca del agua. El contacto con el agua puede provocar un shock elctrico. Dana Allan ATENCIN MDICA SI:   Corene Cornea, enrojecimiento, hinchazn o adormecimiento.  Tiene algn problema nuevo.  Los sntomas empeoran.  Tiene preguntas o preocupaciones. Si tiene algn problema, deje de usar la terapia de calor hasta que consulte al mdico.  ASEGRESE DE QUE:   Comprende estas instrucciones.  Controlar su enfermedad.  Solicitar ayuda de inmediato si no mejora o si empeora. Document Released: 08/13/2011 [facility] Patient Information 2015 [place]. This information is not intended to replace advice given to you by your health care provider. Make sure you discuss any questions you have with your health care provider.  Crioterapia  (Cryotherapy)  El trmino crioterapia significa tratamiento mediante el fro. Bolsas con hielo o gel se utilizan para reducir Conservation officer, historic buildings y la inflamacin. El hielo es ms efectivo dentro de las primeras 24 a 31 horas despus de una lesin o trastornos por uso excesivo de un msculo o Insurance claims handler. El hielo puede calmar esguinces, distensiones, espasmos, ardor, dolor punzante y [employer]. Tambin puede usarse para la recuperacin luego de [place]. El hielo es [place], tiene muy pocos efectos adversos y es seguro para que lo utilicen la mayora de [employer].  PRECAUCIONES  El hielo no es una opcin segura de tratamiento para las personas con:   Fenmeno de [place]. Este es un trastorno que afecta los vasos sanguneos pequeos en las extremidades. La exposicin al fro Koh[employer]'s problemas  vuelvan.  Hipersensibilidad al fro. Hay diferentes tipos de hipersensibilidad al fro, [employer] se incluyen:  Urticaria por el fro. Ronchas rojas y que pican que aparecen en la piel cuando los tejidos comienzan a calentarse despus de recibir el fro.  Eritema por fro. Se trata de una erupcin de color rojo y que pica, causada por la exposicin al fro.  Hemoglobinuria por fro. Los glbulos rojos se destruyen cuando los tejidos comienzan a calentarse despus de enfriarse. La hemoglobina que transporta oxgeno pasa a la orina debido a que no se puede combinar con protenas de la sangre lo suficientemente rpido.  Entumecimiento o alteracin de la sensibilidad en el rea que se enfra. Si usted tiene [employer] Power, no utilice hielo hasta que haya hablado con su mdico:   Enfermedades cardacas, como arritmias, angina o enfermedad cardaca crnica.  Hipertensin arterial.  Heridas que se estn curando o abiertas en la zona en la que va a aplicar el hielo.  Infecciones actuales.  Artritis reumatoidea.  Mala circulacin.  Diabetes. El hielo disminuye el flujo de sangre en la regin en la que se aplica. Esto es beneficioso cuando se trata de evitar que se propaguen ciertas sustancias qumicas irritantes desde los tejidos inflamados a los tejidos circundantes. Sin embargo, si se expone la piel a las temperaturas fras durante demasiado tiempo o sin la proteccin [place], puede daarse la piel o los nervios. Observe si hay seales de dao en la piel debido al fro.  INSTRUCCIONES PARA EL CUIDADO EN EL HOGAR  Siga estos consejos para usar hielo y compresas fras con seguridad.   Coloque una toalla seca o hmeda entre el hielo y la piel. Una toalla hmeda se enfriar ms rpidamente la piel, lo que puede hacer necesario acortar el tiempo que se utiliza el hielo.  Para obtener una respuesta ms rpida, puede comprimir suavemente el hielo.  Aplique el hielo  durante no ms de 10 a 20 minutos a la vez. [place] ms hueso haya en la zona en la que aplique el hielo, menos tiempo se necesitar para obtener los beneficios.  Revise su piel despus de 5 minutos para asegurarse de que no hay seales de [employer] al fro o un dao en la piel.  Descanse 20 minutos o ms [employer].  Una vez que la piel est adormecida, puede finalizar el [place]. Puede probar si hay adormecimiento tocando ligeramente la piel. El toque debe ser tan ligero que no deje un hoyuelo en la piel por la presin hecha con la punta del dedo. Al aplicar hielo, la [employer] de las personas sentirn sensaciones normales en este orden: fro, ardor, dolor y entumecimiento.  No use hielo sobre alguien que no puede comunicar sus respuestas al dolor, como los nios pequeos o personas con demencia. [place] compresas de hielo son el modo ms frecuente de Risk manager la terapia con hielo. Otros mtodos son los masajes con hielo, baos de hielo, y aerosol fro. Las cremas musculares que producen fro, sensacin de hormigueo no ofrecen los mismos beneficios que ofrece el hielo y no debe ser utilizado como un sustituto excepto que lo recomiende su mdico.  Para hacer una compresa de hielo, haga lo siguiente:   Ponga hielo picado o una bolsa de verduras congeladas en una bolsa de plstico con cierre. Extraiga el exceso de [place]. Coloque esta bolsa dentro de [place] bolsa de plstico. Deslice la bolsa en una funda de almohada o coloque una toalla hmeda entre su piel y la [place].  Mezcle 3 partes de agua con 1 parte de alcohol fino. Congelar la mezcla en una bolsa plstica con cierre. Cuando se retira Scientific laboratory technician del Pension scheme manager, tendr un aspecto fangoso. Extraiga el exceso de [place]. Coloque esta bolsa dentro de [place] bolsa de plstico. Deslice la bolsa en una funda de almohada o coloque una toalla hmeda entre su piel y la [place]. SOLICITE ATENCIN MDICA SI:    Tiene manchas blancas en la piel. Esto puede dar a la piel una apariencia (moteada).  Su piel se vuelve azul o plida.  Tiene un aspecto ceroso o est dura.  La hinchazn empeora. ASEGRESE DE QUE:   Comprende estas instrucciones.  Controlar su enfermedad.  Solicitar ayuda de inmediato si no mejora o si empeora. Document Released: 05/10/2011 Document Revised: 08/13/2011 [employer] Patient Information 2015 [employer]. This information is not intended to replace advice given to you by your health care provider. Make sure you discuss any questions you have with your health care provider.

## 2013-12-15 NOTE — ED Provider Notes (Signed)
CSN: [id number]     Arrival date & time 12/15/13  1959 History   First MD Initiated Contact with Patient 12/15/13 2031     This chart was scribed for non-physician practitioner, Zacarias Pontes PA-C working with Hoy Morn, MD by Forrestine Him, ED Scribe. This patient was seen in room TR04C/TR04C and the patient's care was started at 9:36 PM.   Chief Complaint  Patient presents with  . Knee Pain   Patient is a 53 y.o. female presenting with knee pain. The history is provided by the patient and a relative. No language interpreter was used.  Knee Pain Location:  Knee Time since incident:  2 months Injury: no   Knee location:  L knee Pain details:    Radiates to:  Does not radiate   Severity:  Moderate   Onset quality:  Gradual   Duration:  2 months   Timing:  Constant   Progression:  Worsening Dislocation: no   Foreign body present:  No foreign bodies Prior injury to area:  No Worsened by:  Flexion Ineffective treatments:  NSAIDs Associated symptoms: no back pain, no fever and no neck pain     HPI Comments: Zykeriah Mathia is a 53 y.o. female with a PMHx of HTN and CHF who presents to the Emergency Department complaining of constant, moderate, non-radiating L knee pain x 2 months that has progressively worsened in the last 24 hours but has not had any injuries or trauma. She she feels as though "something is stuck in there" and currently rates it 10/10.  She reports intermitting "giving away" feeling, but has not had any falls or trauma. This pain is exacerbated with flexion of the knee. She has tried OTC Naproxen with mild temporary improvement. She has also tried rubbing the area with baby oil each night. At this time she denies any fever, chills, swelling, warmth, redness, numbness, weakness, paresthesia, or loss of sensation. She has not noted any redness to the knee. No family history of Gout. She has no other pertinent past medical history. No other concerns this visit.     Past Medical History  Diagnosis Date  . CHF (congestive heart failure)   . Hypertension    Past Surgical History  Procedure Laterality Date  . Tubal ligation    . Cesarean section     No family history on file. History  Substance Use Topics  . Smoking status: Never Smoker   . Smokeless tobacco: Not on file  . Alcohol Use: No   OB History   Grav Para Term Preterm Abortions TAB SAB Ect Mult Living                 Review of Systems  Constitutional: Negative for fever and chills.  Respiratory: Negative for cough and shortness of breath.   Cardiovascular: Negative for chest pain.  Musculoskeletal: Positive for arthralgias (L knee). Negative for back pain, joint swelling, myalgias, neck pain and neck stiffness.  Skin: Negative for color change and rash.  Neurological: Negative for weakness, numbness and headaches.  Psychiatric/Behavioral: Negative for confusion.  10 Systems reviewed and are negative for acute change except as noted in the HPI.     Allergies  Review of patient's allergies indicates no known allergies.  Home Medications   Prior to Admission medications   Medication Sig Start Date End Date Taking? Authorizing Provider  DiphenhydrAMINE HCl (ZZZQUIL) 50 MG/30ML LIQD Take 10 mLs by mouth daily as needed. For sleep   Yes Historical  Provider, MD  furosemide (LASIX) 40 MG tablet Take 40 mg by mouth daily.   Yes Historical Provider, MD  naproxen (NAPROSYN) 250 MG tablet Take 250 mg by mouth daily as needed.   Yes Historical Provider, MD  nitroGLYCERIN (NITROSTAT) 0.4 MG SL tablet Place 0.4 mg under the tongue every 5 (five) minutes as needed for chest pain.   Yes Historical Provider, MD  diclofenac sodium (VOLTAREN) 1 % GEL Apply 4 g topically 4 (four) times daily. 12/15/13   Spencer Peterkin Strupp Camprubi-Soms, PA-C  naproxen (NAPROSYN) 500 MG tablet Take 1 tablet (500 mg total) by mouth 2 (two) times daily as needed for mild pain, moderate pain or headache (TAKE WITH  MEALS.). 12/15/13   Silvia Markuson Strupp Camprubi-Soms, PA-C  oxyCODONE-acetaminophen (PERCOCET) 5-325 MG per tablet Take 1-2 tablets by mouth every 6 (six) hours as needed for severe pain. 12/15/13   Cypress Fanfan Strupp Camprubi-Soms, PA-C   Triage Vitals: BP 121/57  Pulse 73  Temp(Src) 98.2 F (36.8 C) (Oral)  Resp 16  Ht 5' (1.524 m)  Wt 220 lb (99.791 kg)  BMI 42.97 kg/m2  SpO2 98%   Physical Exam  Nursing note and vitals reviewed. Constitutional: She is oriented to person, place, and time. Vital signs are normal. She appears well-developed and well-nourished. No distress.  Obese, in NAD  HENT:  Head: Normocephalic.  Mouth/Throat: Mucous membranes are normal.  Eyes: Conjunctivae and EOM are normal.  Neck: Normal range of motion. Neck supple.  Cardiovascular: Normal rate, intact distal pulses and normal pulses.   Distal pulses intact  Pulmonary/Chest: Effort normal.  Abdominal: Normal appearance. She exhibits no distension.  Musculoskeletal:       Right hip: Normal.       Left hip: Normal.       Right knee: Normal.       Left knee: She exhibits decreased range of motion. She exhibits no swelling, no effusion, no deformity, no erythema, normal alignment, no LCL laxity, normal patellar mobility, no bony tenderness, normal meniscus and no MCL laxity. Tenderness found. Medial joint line tenderness noted.       Left ankle: Normal.       Legs: L knee with dec ROM secondary to pain, no swelling erythema or effusion, no deformity, no malalignment. No laxity with varus and valgus stress. TTP in medial joint line, neg lachman's test, neg ant/post drawer test, neg mcmurray's test. No patellar malalignment or ballottment. Mild crepitus felt with knee flexion. Strength 5/5 in all extremities. Sensation grossly intact.  Neurological: She is alert and oriented to person, place, and time. She has normal strength. No sensory deficit.  Strength 5/5 in all extremities, sensation grossly intact, gait mildly  antalgic  Skin: Skin is warm, dry and intact. No rash noted. No erythema.  Psychiatric: She has a normal mood and affect.    ED Course  Procedures (including critical care time)  DIAGNOSTIC STUDIES: Oxygen Saturation is 98%on RA, Normal by my interpretation.    COORDINATION OF CARE: 8:41 PM- Will give Percocet. Will order DG Knee Complete 4 Views L. Spoke to family regarding benefits of losing weight. Discussed proper food choices, portion control, along with possible water aerobics. Advised pt to follow up with Orthopedics. Discussed treatment plan with pt at bedside and pt agreed to plan.     Labs Review Labs Reviewed - No data to display  Imaging Review Dg Knee Complete 4 Views Left  12/15/2013   CLINICAL DATA:  Pain for 2 months duration  EXAM: LEFT KNEE - COMPLETE 4+ VIEW  COMPARISON:  None.  FINDINGS: Frontal, lateral, and bilateral oblique views were obtained. There is no fracture or dislocation. No effusion. There is slight narrowing medially with spurring medially. No erosive change.  IMPRESSION: Mild osteoarthritic change.  No fracture or effusion.   Electronically Signed   By: Lowella Grip M.D.   On: 12/15/2013 21:36     EKG Interpretation None      MDM   Final diagnoses:  Pain in joint, lower leg, left   Crystalann Korf is a 53 y.o. female with L knee pain x 2 mos, works as a Electrical engineer, obese, no recent injury or trauma. Exam relatively benign, mild medial joint line TTP. Low clinical suspicion for gout or septic joint, no effusion noted. Xray reveals mild OA. Discussed use of voltaren gel, percocet and naprosyn for pain. Discussed importance of wt loss to help alleviate stress on knees. Discussed that this could lead to the need for knee replacement down the line. Discussed f/up with orthopedic dr. Hanley Seamen knee sleeve, pt states she has crutches at home. I explained the diagnosis and have given explicit precautions to return to the ER including for any other new or  worsening symptoms. The patient understands and accepts the medical plan as it's been dictated and I have answered their questions. Discharge instructions concerning home care and prescriptions have been given. The patient is STABLE and is discharged to home in good condition.   I personally performed the services described in this documentation, which was scribed in my presence. The recorded information has been reviewed and is accurate.   BP 121/57  Pulse 73  Temp(Src) 98.2 F (36.8 C) (Oral)  Resp 16  Ht 5' (1.524 m)  Wt 220 lb (99.791 kg)  BMI 42.97 kg/m2  SpO2 98%   [employer], PA-C 12/16/13 [phone]

## 2013-12-15 NOTE — ED Notes (Signed)
Pt states for the past two months or so she has been having left knee pain that has gotten more severe today.  Pt states it hurts to bend her knee

## 2013-12-15 NOTE — ED Notes (Signed)
Pt c/o pain to left knee for approx 2 months.  No known injury.  St's today she has been unable to bend knee.

## 2013-12-19 NOTE — ED Provider Notes (Signed)
Medical screening examination/treatment/procedure(s) were performed by non-physician practitioner and as supervising physician I was immediately available for consultation/collaboration.   EKG Interpretation None        Hoy Morn, MD 12/19/13 [phone]

## 2014-01-29 ENCOUNTER — Encounter (HOSPITAL_COMMUNITY): Payer: Self-pay | Admitting: Emergency Medicine

## 2014-01-29 DIAGNOSIS — Z3202 Encounter for pregnancy test, result negative: Secondary | ICD-10-CM | POA: Insufficient documentation

## 2014-01-29 DIAGNOSIS — B373 Candidiasis of vulva and vagina: Secondary | ICD-10-CM | POA: Insufficient documentation

## 2014-01-29 DIAGNOSIS — B3731 Acute candidiasis of vulva and vagina: Secondary | ICD-10-CM | POA: Insufficient documentation

## 2014-01-29 DIAGNOSIS — I509 Heart failure, unspecified: Secondary | ICD-10-CM | POA: Insufficient documentation

## 2014-01-29 DIAGNOSIS — N949 Unspecified condition associated with female genital organs and menstrual cycle: Secondary | ICD-10-CM | POA: Insufficient documentation

## 2014-01-29 DIAGNOSIS — I10 Essential (primary) hypertension: Secondary | ICD-10-CM | POA: Insufficient documentation

## 2014-01-29 NOTE — ED Notes (Signed)
Pt here with vaginal irritation.  Pt denies any discharge.  Pt is taking OTC monistat.  Pt sts it feels like she is swollen.  Pt sts she is itchy.

## 2014-01-30 ENCOUNTER — Emergency Department (HOSPITAL_COMMUNITY)
Admission: EM | Admit: 2014-01-30 | Discharge: 2014-01-30 | Disposition: A | Discharge status: Home / Self Care | Payer: Self-pay | Attending: Emergency Medicine | Admitting: Emergency Medicine

## 2014-01-30 ENCOUNTER — Emergency Department (HOSPITAL_COMMUNITY): Payer: Self-pay

## 2014-01-30 DIAGNOSIS — B373 Candidiasis of vulva and vagina: Secondary | ICD-10-CM

## 2014-01-30 DIAGNOSIS — B3731 Acute candidiasis of vulva and vagina: Secondary | ICD-10-CM

## 2014-01-30 LAB — URINALYSIS, ROUTINE W REFLEX MICROSCOPIC
BILIRUBIN URINE: NEGATIVE
Glucose, UA: NEGATIVE mg/dL
Hgb urine dipstick: NEGATIVE
KETONES UR: NEGATIVE mg/dL
Leukocytes, UA: NEGATIVE
NITRITE: NEGATIVE
PROTEIN: NEGATIVE mg/dL
Specific Gravity, Urine: 1.034 — ABNORMAL HIGH (ref 1.005–1.030)
Urobilinogen, UA: 0.2 mg/dL (ref 0.0–1.0)
pH: 5.5 (ref 5.0–8.0)

## 2014-01-30 LAB — WET PREP, GENITAL
Clue Cells Wet Prep HPF POC: NONE SEEN
Trich, Wet Prep: NONE SEEN
Yeast Wet Prep HPF POC: NONE SEEN

## 2014-01-30 LAB — PREGNANCY, URINE: Preg Test, Ur: NEGATIVE

## 2014-01-30 MED ORDER — FLUCONAZOLE 100 MG PO TABS
150.0000 mg | ORAL_TABLET | Freq: Once | ORAL | Status: AC
  Administered 2014-01-30: 150 mg via ORAL
  Filled 2014-01-30: qty 2

## 2014-01-30 NOTE — ED Provider Notes (Signed)
CSN: [id number]     Arrival date & time 01/29/14  2044 History   First MD Initiated Contact with Patient 01/30/14 0044     Chief Complaint  Patient presents with  . Vaginal Pain     (Consider location/radiation/quality/duration/timing/severity/associated sxs/prior Treatment) HPI  Marcia Mccoy is a 53 y.o. female with no significant past medical history coming in with vaginal pruritus and swelling. Patient states has been present for the past week. She states her labia is are very itchy and as a result they have swollen. She denies his having in the past. She denies any vaginal bleeding or discharge. She has no dysuria or hematuria. She denies unprotected sex or the possibility of being pregnant. Recent no longer gets her menstrual cycles. She has been using miconazole cream for treatment at home. She denies any abdominal pain nausea vomiting or fevers. She states that the itching and swelling has been constant, nothing makes it better or worse.  Past Medical History  Diagnosis Date  . CHF (congestive heart failure)   . Hypertension    Past Surgical History  Procedure Laterality Date  . Tubal ligation    . Cesarean section     History reviewed. No pertinent family history. History  Substance Use Topics  . Smoking status: Never Smoker   . Smokeless tobacco: Not on file  . Alcohol Use: No   OB History   Grav Para Term Preterm Abortions TAB SAB Ect Mult Living                 Review of Systems 10 Systems reviewed and are negative for acute change except as noted in the HPI.    Allergies  Review of patient's allergies indicates no known allergies.  Home Medications   Prior to Admission medications   Medication Sig Start Date End Date Taking? Authorizing Provider  DiphenhydrAMINE HCl (ZZZQUIL) 50 MG/30ML LIQD Take 10 mLs by mouth daily as needed. For sleep   Yes Historical Provider, MD  miconazole (MICOTIN) 200 MG vaginal suppository Place 200 mg vaginally at bedtime.    Yes Historical Provider, MD  nitroGLYCERIN (NITROSTAT) 0.4 MG SL tablet Place 0.4 mg under the tongue every 5 (five) minutes as needed for chest pain.   Yes Historical Provider, MD   BP 120/57  Pulse 57  Temp(Src) 97.8 F (36.6 C) (Oral)  Resp 20  Ht 5' (1.524 m)  Wt 210 lb (95.255 kg)  BMI 41.01 kg/m2  SpO2 97% Physical Exam  Nursing note and vitals reviewed. Constitutional: She is oriented to person, place, and time. She appears well-developed and well-nourished. No distress.  HENT:  Head: Normocephalic and atraumatic.  Eyes: Conjunctivae and EOM are normal. Pupils are equal, round, and reactive to light. No scleral icterus.  Neck: Normal range of motion. Neck supple. No JVD present. No tracheal deviation present. No thyromegaly present.  Cardiovascular: Normal rate, regular rhythm and normal heart sounds.  Exam reveals no gallop and no friction rub.   No murmur heard. Pulmonary/Chest: Effort normal and breath sounds normal. No respiratory distress. She has no wheezes. She exhibits no tenderness.  Abdominal: Soft. Bowel sounds are normal. She exhibits no distension and no mass. There is no tenderness. There is no rebound and no guarding.  Genitourinary:  Patient requests a female examiner, please see the PA note for this exam.  Musculoskeletal: Normal range of motion. She exhibits no edema and no tenderness.  Lymphadenopathy:    She has no cervical adenopathy.  Neurological: She  is alert and oriented to person, place, and time.  Skin: Skin is warm and dry. No rash noted. No erythema. No pallor.    ED Course  Procedures (including critical care time) Labs Review Labs Reviewed  WET PREP, GENITAL - Abnormal; Notable for the following:    WBC, Wet Prep HPF POC FEW (*)    All other components within normal limits  URINALYSIS, ROUTINE W REFLEX MICROSCOPIC - Abnormal; Notable for the following:    Specific Gravity, Urine 1.034 (*)    All other components within normal limits   GC/CHLAMYDIA PROBE AMP  PREGNANCY, URINE    Imaging Review US Transvaginal Non-ob  01/30/2014   CLINICAL DATA:  Right adnexal tenderness.  EXAM: TRANSABDOMINAL AND TRANSVAGINAL ULTRASOUND OF PELVIS  TECHNIQUE: Both transabdominal and transvaginal ultrasound examinations of the pelvis were performed. Transabdominal technique was performed for global imaging of the pelvis including uterus, ovaries, adnexal regions, and pelvic cul-de-sac. It was necessary to proceed with endovaginal exam following the transabdominal exam to visualize the uterus and ovaries.  COMPARISON:  CT abdomen and pelvis 07/16/2010  FINDINGS: Uterus  Measurements: 6.9 x 3.3 x 4.7 cm. Anteverted. Dense heterogeneous appearance of the uterine myometrium. No focal mass lesions are demonstrated. Small nabothian cysts in the cervix.  Endometrium  Thickness: 3.1 mm, partially obscured due to poor penetration. No focal abnormality visualized.  Right ovary  Not visualized.  No adnexal masses seen.  Left ovary  Not visualized.  No adnexal masses seen.  Other findings  No free fluid.  IMPRESSION: Nonspecific heterogeneous myometrial parenchymal pattern. No focal mass or calcification. Endometrial thickness is normal. Ovaries are not visualized.   Electronically Signed   By: Lucienne Capers M.D.   On: 01/30/2014 03:19   US Pelvis Complete  01/30/2014   CLINICAL DATA:  Right adnexal tenderness.  EXAM: TRANSABDOMINAL AND TRANSVAGINAL ULTRASOUND OF PELVIS  TECHNIQUE: Both transabdominal and transvaginal ultrasound examinations of the pelvis were performed. Transabdominal technique was performed for global imaging of the pelvis including uterus, ovaries, adnexal regions, and pelvic cul-de-sac. It was necessary to proceed with endovaginal exam following the transabdominal exam to visualize the uterus and ovaries.  COMPARISON:  CT abdomen and pelvis 07/16/2010  FINDINGS: Uterus  Measurements: 6.9 x 3.3 x 4.7 cm. Anteverted. Dense heterogeneous  appearance of the uterine myometrium. No focal mass lesions are demonstrated. Small nabothian cysts in the cervix.  Endometrium  Thickness: 3.1 mm, partially obscured due to poor penetration. No focal abnormality visualized.  Right ovary  Not visualized.  No adnexal masses seen.  Left ovary  Not visualized.  No adnexal masses seen.  Other findings  No free fluid.  IMPRESSION: Nonspecific heterogeneous myometrial parenchymal pattern. No focal mass or calcification. Endometrial thickness is normal. Ovaries are not visualized.   Electronically Signed   By: Lucienne Capers M.D.   On: 01/30/2014 03:19     EKG Interpretation None      MDM   Final diagnoses:  Yeast infection involving the vagina and surrounding area    Patient did see emergency department out of concern for vaginal irritation and itching. Pelvic exam is performed by the PA revealed a possible yeast infection. Because of findings of CMT and adnexal tenderness a transvaginal ultrasound was obtained and was negative. Patient was treated with Diflucan and was advised to followup with her primary doctor within 3 days.  Patient's vital signs remained at her baseline. Patient safe for discharge.   Everlene Balls, MD 01/30/14 [phone]

## 2014-01-30 NOTE — ED Provider Notes (Signed)
I performed Marcia Mccoy's pelvic exam at 1:35 AM with the findings as below:  No rashes, lesions, or tenderness to external genitalia, labia majora without swelling or erythema. Mild erythema and tenderness to vaginal mucosa, slightly worse at L introitus. Trace white vaginal discharge within vaginal vault. No bleeding within vaginal vault. +R sided adnexal tenderness without adnexal masses or fullness. +CMT. No  cervical friability or discharge from cervical os. Uterus non-deviated, mobile, nonTTP, and without enlargement. Swabs obtained and sent.  Discussed above findings with Dr. Claudine Mouton, who is her provider during this ED stay. Please refer to his note for ongoing management of pt's medical condition.  Camprubi-Soms, Marcia Sermons, PA-C    Marcia Mccoy, Marcia Mccoy 01/30/14 0144   Medical screening examination/treatment/procedure(s) were conducted as a shared visit with non-physician practitioner(s) and myself.  I personally evaluated the patient during the encounter.   EKG Interpretation None        Everlene Balls, MD 01/30/14 [phone]

## 2014-01-30 NOTE — Discharge Instructions (Signed)
Vulvovaginitis Candidisica (Candidal Vulvovaginitis) You were seen today for vaginal itching and pain.  You were found to have a yeast infection on exam and were treated.  Your ultrasound results are below.  Follow up with a primary care physician for continued care.  If your symptoms worsen, return to the ED for repeat evaluation.  Thank you. La vulvovaginitis candidisica es una infeccin de la vagina y la vulva. La vulva es la piel que rodea la abertura de la vagina. Puede causar picazn y [place] de y alrededor de la vagina.  CUIDADOS EN EL HOGAR  Slo tome medicamentos como lo indique su mdico.  No mantenga relaciones sexuales hasta que la infeccin haya curado o segn le indique el mdico.  Practique sexo seguro.  Informe a su compaero sexual acerca de su infeccin.  No tome duchas vaginales ni use tampones.  Use ropa interior de algodn. No utilice pantalones ni pantimedias ajustados.  Coma yogur. Esto puede ayudar a tratar y [place] infecciones por cndida. SOLICITE AYUDA DE INMEDIATO SI:   Tiene fiebre.  [place], o si no mejora luego de 3 das.  Tiene malestar, irritacin, o picazn en la zona de la vagina o la vulva.  Siente dolor en al [employer].  Comienza a sentir dolor abdominal. ASEGRESE DE QUE:  Comprende estas instrucciones.  Controlar su enfermedad.  Solicitar ayuda de inmediato si no mejora o empeora. Document Released: 06/23/2010 Document Revised: 05/26/2013 [facility] Patient Information 2015 [place]. This information is not intended to replace advice given to you by your health care provider. Make sure you discuss any questions you have with your health care provider. Candidal Vulvovaginitis Te vieron hoy para picazn vaginal y dolor. Usted fue encontrado para tener una infeccin por levaduras en el examen y nos trataron . Sus resultados de la ecografa se encuentran por  debajo . Haga un seguimiento con un mdico de atencin primaria para la atencin continuada. Si sus sntomas empeoran , vuelva a la ED para la evaluacin de repeticin. Gracias. Candidal vulvovaginitis is an infection of the vagina and vulva. The vulva is the skin around the opening of the vagina. This may cause itching and discomfort in and around the vagina.  HOME CARE  Only take medicine as told by your doctor.  Do not have sex (intercourse) until the infection is healed or as told by your doctor.  Practice safe sex.  Tell your sex partner about your infection.  Do not douche or use tampons.  Wear cotton underwear. Do not wear tight pants or panty hose.  Eat yogurt. This may help treat and prevent yeast infections. GET HELP RIGHT AWAY IF:   You have a fever.  Your problems get worse during treatment or do not get better in 3 days.  You have discomfort, irritation, or itching in your vagina or vulva area.  You have pain after sex.  You start to get belly (abdominal) pain. MAKE SURE YOU:  Understand these instructions.  Will watch your condition.  Will get help right away if you are not doing well or get worse. Document Released: 08/17/2008 Document Revised: 05/26/2013 Document Reviewed: 08/17/2008 [facility] Patient Information 2015 [place], [place]. This information is not intended to replace advice given to you by your health care provider. Make sure you discuss any questions you have with your health care provider.

## 2014-01-30 NOTE — ED Notes (Signed)
Pt alert and oriented at discharge.  Pt and family at bedside verbalized understanding of discharge instructions and the need for follow up care.  Pt offered a wheelchair but denied.  Pt escorted to the waiting room by this RN.

## 2014-02-02 LAB — GC/CHLAMYDIA PROBE AMP
CT Probe RNA: NEGATIVE
GC Probe RNA: NEGATIVE

## 2014-07-06 ENCOUNTER — Encounter (HOSPITAL_COMMUNITY): Payer: Self-pay | Admitting: Emergency Medicine

## 2014-07-06 ENCOUNTER — Emergency Department (HOSPITAL_COMMUNITY)
Admission: EM | Admit: 2014-07-06 | Discharge: 2014-07-06 | Disposition: A | Discharge status: Home / Self Care | Payer: 59 | Attending: Emergency Medicine | Admitting: Emergency Medicine

## 2014-07-06 ENCOUNTER — Emergency Department (HOSPITAL_COMMUNITY): Payer: 59

## 2014-07-06 DIAGNOSIS — Z79899 Other long term (current) drug therapy: Secondary | ICD-10-CM | POA: Diagnosis not present

## 2014-07-06 DIAGNOSIS — I10 Essential (primary) hypertension: Secondary | ICD-10-CM | POA: Insufficient documentation

## 2014-07-06 DIAGNOSIS — R42 Dizziness and giddiness: Secondary | ICD-10-CM | POA: Diagnosis not present

## 2014-07-06 DIAGNOSIS — H9319 Tinnitus, unspecified ear: Secondary | ICD-10-CM | POA: Diagnosis not present

## 2014-07-06 DIAGNOSIS — I509 Heart failure, unspecified: Secondary | ICD-10-CM | POA: Insufficient documentation

## 2014-07-06 DIAGNOSIS — R112 Nausea with vomiting, unspecified: Secondary | ICD-10-CM | POA: Diagnosis present

## 2014-07-06 LAB — LIPASE, BLOOD: Lipase: 32 U/L (ref 11–59)

## 2014-07-06 LAB — URINE MICROSCOPIC-ADD ON

## 2014-07-06 LAB — CBC WITH DIFFERENTIAL/PLATELET
BASOS ABS: 0 10*3/uL (ref 0.0–0.1)
BASOS PCT: 0 % (ref 0–1)
EOS ABS: 0.1 10*3/uL (ref 0.0–0.7)
Eosinophils Relative: 2 % (ref 0–5)
HEMATOCRIT: 44.2 % (ref 36.0–46.0)
HEMOGLOBIN: 14.7 g/dL (ref 12.0–15.0)
LYMPHS ABS: 1.4 10*3/uL (ref 0.7–4.0)
LYMPHS PCT: 18 % (ref 12–46)
MCH: 28.9 pg (ref 26.0–34.0)
MCHC: 33.3 g/dL (ref 30.0–36.0)
MCV: 86.8 fL (ref 78.0–100.0)
Monocytes Absolute: 0.4 10*3/uL (ref 0.1–1.0)
Monocytes Relative: 6 % (ref 3–12)
Neutro Abs: 5.7 10*3/uL (ref 1.7–7.7)
Neutrophils Relative %: 74 % (ref 43–77)
PLATELETS: 245 10*3/uL (ref 150–400)
RBC: 5.09 MIL/uL (ref 3.87–5.11)
RDW: 13.2 % (ref 11.5–15.5)
WBC: 7.7 10*3/uL (ref 4.0–10.5)

## 2014-07-06 LAB — COMPREHENSIVE METABOLIC PANEL
ALBUMIN: 4.2 g/dL (ref 3.5–5.2)
ALT: 25 U/L (ref 0–35)
AST: 24 U/L (ref 0–37)
Alkaline Phosphatase: 86 U/L (ref 39–117)
Anion gap: 7 (ref 5–15)
BUN: 17 mg/dL (ref 6–23)
CO2: 26 mmol/L (ref 19–32)
Calcium: 9.4 mg/dL (ref 8.4–10.5)
Chloride: 105 mmol/L (ref 96–112)
Creatinine, Ser: 0.78 mg/dL (ref 0.50–1.10)
GFR calc Af Amer: >90 mL/min (ref >90)
GFR calc non Af Amer: >90 mL/min (ref >90)
Glucose, Bld: 120 mg/dL — ABNORMAL HIGH (ref 70–99)
Potassium: 3.6 mmol/L (ref 3.5–5.1)
Sodium: 138 mmol/L (ref 135–145)
Total Bilirubin: 0.2 mg/dL — ABNORMAL LOW (ref 0.3–1.2)
Total Protein: 8.1 g/dL (ref 6.0–8.3)

## 2014-07-06 LAB — URINALYSIS, ROUTINE W REFLEX MICROSCOPIC
GLUCOSE, UA: NEGATIVE mg/dL
Hgb urine dipstick: NEGATIVE
KETONES UR: 15 mg/dL — AB
Nitrite: NEGATIVE
PH: 6 (ref 5.0–8.0)
Protein, ur: 30 mg/dL — AB
SPECIFIC GRAVITY, URINE: 1.033 — AB (ref 1.005–1.030)
UROBILINOGEN UA: 1 mg/dL (ref 0.0–1.0)

## 2014-07-06 LAB — TROPONIN I: Troponin I: <0.03 ng/mL (ref <0.031)

## 2014-07-06 MED ORDER — MECLIZINE HCL 25 MG PO TABS
25.0000 mg | ORAL_TABLET | Freq: Once | ORAL | Status: AC
  Administered 2014-07-06: 25 mg via ORAL
  Filled 2014-07-06: qty 1

## 2014-07-06 MED ORDER — SODIUM CHLORIDE 0.9 % IV BOLUS (SEPSIS): 1000.0000 mL | Freq: Once | INTRAVENOUS | Status: DC

## 2014-07-06 MED ORDER — MECLIZINE HCL 25 MG PO TABS: 25.0000 mg | ORAL_TABLET | Freq: Four times a day (QID) | ORAL | Status: DC | PRN

## 2014-07-06 NOTE — ED Provider Notes (Signed)
CSN: [id number]     Arrival date & time 07/06/14  1803 History   First MD Initiated Contact with Patient 07/06/14 1925     Chief Complaint  Patient presents with  . Emesis     (Consider location/radiation/quality/duration/timing/severity/associated sxs/prior Treatment) HPI  54 year old female presents with dizziness, nausea and vomiting since 48 hours ago. This woke her up in the middle the night around 3 AM. Dizziness has been constant. Feels like the room is spinning. Denies any headaches or weakness. No numbness but has been unable to eat or drink due to the nausea. Unable to get up because of the dizziness. Any type of movement makes it worse. She's never had vertigo or similar symptoms in the past. Had chest pains one week ago but no chest pain since or during the symptoms. No abdominal pain. No diarrhea. No urinary symptoms.  Past Medical History  Diagnosis Date  . CHF (congestive heart failure)   . Hypertension    Past Surgical History  Procedure Laterality Date  . Tubal ligation    . Cesarean section     History reviewed. No pertinent family history. History  Substance Use Topics  . Smoking status: Never Smoker   . Smokeless tobacco: Not on file  . Alcohol Use: No   OB History    No data available     Review of Systems  Constitutional: Negative for fever.  HENT: Positive for tinnitus (constantly for the past 15 years).   Eyes: Negative for photophobia and visual disturbance.  Respiratory: Negative for shortness of breath.   Cardiovascular: Negative for chest pain.  Gastrointestinal: Positive for nausea and vomiting. Negative for abdominal pain and diarrhea.  Genitourinary: Negative for dysuria.  Neurological: Positive for dizziness. Negative for weakness and headaches.  All other systems reviewed and are negative.     Allergies  Review of patient's allergies indicates no known allergies.  Home Medications   Prior to Admission medications   Medication Sig  Start Date End Date Taking? Authorizing Provider  DiphenhydrAMINE HCl (ZZZQUIL) 50 MG/30ML LIQD Take 10 mLs by mouth daily as needed (sleep). For sleep   Yes Historical Provider, MD  furosemide (LASIX) 40 MG tablet Take 40 mg by mouth daily.   Yes Historical Provider, MD  naproxen (NAPROSYN) 500 MG tablet Take 500 mg by mouth daily as needed for moderate pain (pain).   Yes Historical Provider, MD  nitroGLYCERIN (NITROSTAT) 0.4 MG SL tablet Place 0.4 mg under the tongue every 5 (five) minutes as needed for chest pain.    Historical Provider, MD   BP 166/79 mmHg  Pulse 66  Temp(Src) 97.7 F (36.5 C) (Oral)  Resp 18  SpO2 98% Physical Exam  Constitutional: She is oriented to person, place, and time. She appears well-developed and well-nourished.  HENT:  Head: Normocephalic and atraumatic.  Right Ear: External ear normal.  Left Ear: External ear normal.  Nose: Nose normal.  Eyes: EOM are normal. Pupils are equal, round, and reactive to light. Right eye exhibits no discharge. Left eye exhibits no discharge.  Cardiovascular: Normal rate, regular rhythm and normal heart sounds.   Pulmonary/Chest: Effort normal and breath sounds normal. She has no wheezes. She has no rales.  Abdominal: Soft. There is no tenderness.  Neurological: She is alert and oriented to person, place, and time.  CN 2-12 grossly intact. 5/5 strength in all 4 extremities. Normal finger to nose  Skin: Skin is warm and dry.  Nursing note and vitals reviewed.  ED Course  Procedures (including critical care time) Labs Review Labs Reviewed  COMPREHENSIVE METABOLIC PANEL - Abnormal; Notable for the following:    Glucose, Bld 120 (*)    Total Bilirubin 0.2 (*)    All other components within normal limits  URINALYSIS, ROUTINE W REFLEX MICROSCOPIC - Abnormal; Notable for the following:    Color, Urine AMBER (*)    APPearance CLOUDY (*)    Specific Gravity, Urine 1.033 (*)    Bilirubin Urine SMALL (*)    Ketones, ur 15  (*)    Protein, ur 30 (*)    Leukocytes, UA MODERATE (*)    All other components within normal limits  URINE MICROSCOPIC-ADD ON - Abnormal; Notable for the following:    Squamous Epithelial / LPF FEW (*)    Bacteria, UA MANY (*)    All other components within normal limits  URINE CULTURE  CBC WITH DIFFERENTIAL/PLATELET  LIPASE, BLOOD  TROPONIN I    Imaging Review Mr Brain Wo Contrast  07/06/2014   CLINICAL DATA:  Initial evaluation for acute vertigo.  EXAM: MRI HEAD WITHOUT CONTRAST  TECHNIQUE: Multiplanar, multiecho pulse sequences of the brain and surrounding structures were obtained without intravenous contrast.  COMPARISON:  None.  FINDINGS: The CSF containing spaces are within normal limits for patient age. No focal parenchymal signal abnormality is identified. No mass lesion, midline shift, or extra-axial fluid collection. Ventricles are normal in size without evidence of hydrocephalus.  No diffusion-weighted signal abnormality is identified to suggest acute intracranial infarct. Gray-white matter differentiation is maintained. Normal flow voids are seen within the intracranial vasculature. No intracranial hemorrhage identified.  The cervicomedullary junction is normal. Mild degenerative changes noted within the visualized upper cervical spine. Pituitary gland is within normal limits. Pituitary stalk is midline. The globes and optic nerves demonstrate a normal appearance with normal signal intensity.  The bone marrow signal intensity is normal. Calvarium is intact. Visualized upper cervical spine is within normal limits.  Scalp soft tissues are unremarkable.  Mild opacity noted within the right frontoethmoidal recess. Paranasal sinuses are otherwise clear. No mastoid effusion.  IMPRESSION: Normal MRI of the brain with no acute intracranial infarct or other abnormality identified. No intracranial mass lesion.   Electronically Signed   By: Jeannine Boga M.D.   On: 07/06/2014 21:21      EKG Interpretation   Date/Time:  Tuesday July 06 2014 20:00:42 EST Ventricular Rate:  58 PR Interval:  147 QRS Duration: 77 QT Interval:  421 QTC Calculation: 413 R Axis:   42 Text Interpretation:  Normal sinus rhythm Low voltage, precordial leads  Borderline T abnormalities, diffuse leads No old tracing to compare  Confirmed by Sable Knoles  MD, Daviel Allegretto [phone]) on 07/06/2014 9:06:09 PM       EKG Interpretation  Date/Time:  Tuesday July 06 2014 21:11:41 EST Ventricular Rate:  66 PR Interval:  148 QRS Duration: 76 QT Interval:  428 QTC Calculation: 448 R Axis:   41 Text Interpretation:  Normal sinus rhythm Low voltage, precordial leads Borderline T abnormalities, diffuse leads no significant change from earlier in the day Confirmed by Timara Loma  MD, Chinonso Linker ([id number]) on 07/07/2014 12:03:06 AM       MDM   Final diagnoses:  Vertigo    Patient symptoms are consistent with vertigo which is likely causing her to have nausea and vomiting. She had remote chest pain 1 week ago but no current chest pain. Has an abnormal EKG with nonspecific T waves, initially unable to  find an old EKG but further chart review so she had similar T-wave abnormalities in 2011. I have low suspicion this is ACS or cardiac related. After Antivert she was able to get up and walk on her own. I observed her walking without any ataxia. Dizziness has resolved. Given she's never had vertigo like this before and MRI was obtained and there is no evidence of stroke or mass. This point a believe she is stable for discharge as she has tolerated fluids. Will follow-up with PCP.    Ephraim Hamburger, MD 07/07/14 [phone]

## 2014-07-06 NOTE — Discharge Instructions (Signed)
Vrtigo postural benigno (Benign Positional Vertigo)  Vrtigo es la sensacin de que el entorno se mueve estando quieto. Es la forma ms frecuente de vrtigo. Benigno significa que la causa del trastorno no es grave. Es ms frecuente en adultos mayores. CAUSAS  Es el resultado de un trastorno en el sistema laberntico. Es una zona en el odo medio que ayuda a controlar el equilibrio. La causa puede ser una infeccin viral, una lesin en la cabeza o un movimiento repetitivo. Sin embargo, a menudo no se Research scientist (life sciences).  SNTOMAS  Los sntomas de vrtigo posicional benigno se producen al mover la cabeza o los ojos en diferentes direcciones. Algunos de los sntomas pueden ser:   Prdida de equilibrio y cadas.  Vmitos.  Visin borrosa.  Mareos.  Nuseas.  Movimientos oculares involuntarios (nistagmus). DIAGNSTICO  El vrtigo postural benigno se diagnostica mediante un examen fsico. Si la causa especfica de su vrtigo posicional benigno es desconocido, su mdico puede indicar diagnsticos por imgenes, como la Health visitor (RM) o la tomografa computada (TC).  TRATAMIENTO  El [employer] podr recomendar movimientos o procedimientos para corregir el vrtigo posicional benigno. Para tratar los sntomas pueden indicarse medicamentos como meclizina, benzodiazepinas y medicamentos para las nuseas. En casos raros, si los sntomas son causados   por ciertos trastornos que afectan el odo interno, es posible que necesite [place].  Clyde indicaciones del mdico.  Muvase lentamente. No haga movimientos bruscos con la cabeza ni el cuerpo.  Evite conducir vehculos.  Evite operar maquinarias pesadas.  Evite realizar tareas que seran peligrosas para usted u otras personas durante un episodio de vrtigo.  Debe ingerir gran cantidad de lquido para mantener la orina de tono claro o color amarillo plido. SOLICITE ATENCIN [place] DE INMEDIATO  SI:   Tiene dificultad para hablar, caminar, siente debilidad o tiene problemas para usar los brazos, las [place] piernas.  Tiene dificultad para respirar.  Sufre un dolor de cabeza intenso.  Las nuseas o los vmitos persisten o [place].  Tiene cambios en la visin.  Sus familiares o amigos notan cambios en su conducta.  El dolor [place].  Tiene fiebre.  Comienza a sentir rigidez en el cuello o sensibilidad a la luz. ASEGRESE DE QUE:   Comprende estas instrucciones.  Controlar su enfermedad.  Solicitar ayuda de inmediato si no mejora o si empeora. Document Released: 09/06/2008 Document Revised: 08/13/2011 [facility] Patient Information 2015 [place]. This information is not intended to replace advice given to you by your health care provider. Make sure you discuss any questions you have with your health care provider.     Mareos (Dizziness) Los mareos son un problema muy frecuente. Se trata de una sensacin de inestabilidad o aturdimiento. Puede sentir que se va a desvanecer. Puede provocarle una lesin si se tropieza o se cae. Las Engineer, manufacturing de cualquier edad pueden sufrir mareos, pero es ms frecuente [employer] ancianos. CAUSAS  La causa puede deberse a muchos problemas diferentes:  Problemas en el odo medio.  Estar de pie [employer].  Infecciones.  Reacciones alrgicas.  Envejecimiento.  Respuesta emocional a distintas cosas, como por ejemplo la visin de sangre.  Efectos secundarios de Ecologist.  Cansancio.  Problemas circulatorios o de presin arterial.  Consumo excesivo de alcohol, medicamentos o drogas.  Respiracin muy rpida (hiperventilacin).  Ritmo cardaco irregular (arritmia).  Bajo recuento de glbulos rojos (anemia).  Embarazo.  Vmitos, diarrea, fiebre u otras  enfermedades que causan la prdida de lquidos (deshidratacin).  Enfermedades o trastornos, como enfermedad de Parkinson, presin alta (hipertensin  arterial), diabetes y problemas tiroideos.  Exposicin al calor extremo. DIAGNSTICO  El mdico le preguntar acerca de los sntomas, y Optometrist un examen fsico y un electrocardiograma (ECG) para registrar la actividad elctrica del corazn. Tambin podr realizarle otras pruebas cardacas o anlisis de sangre para determinar la causa de los [place]. Estos pueden ser:  Ecocardiograma transtorcico (ETT). Durante IT trainer, se usan ondas sonoras para evaluar cmo fluye la sangre por el corazn.  Ecocardiograma transesofgico (ETE).  Monitoreo cardaco. Este estudio permite que el mdico controle la frecuencia y el ritmo cardaco en tiempo real.  Monitor Holter. Es un dispositivo porttil que Alber[employer]on's latidos cardacos y [place] a Retail buyer las arritmias cardacas. Le permite al [employer] registrar la actividad [place], si es necesario.  Pruebas de estrs por ejercicio o por medicamentos que aceleran los latidos cardacos. TRATAMIENTO  El tratamiento de los mareos depende de la causa de los sntomas y Economist. INSTRUCCIONES PARA EL CUIDADO EN EL HOGAR   Beba suficiente lquido para mantener la orina clara o de color amarillo plido. Esto es realmente importante cuando el clima es muy caluroso. En los adultos [place], tambin es importante cuando hace fro.  Tome los medicamentos exactamente como se lo hayan indicado, si los mareos se deben a los medicamentos. Cuando tome medicamentos para la presin arterial, es muy importante que se levante lentamente.  Levntese de las sillas con lentitud y apyese hasta sentirse bien.  Por la maana, sintese primero a un lado de la cama. Cuando se sienta bien, pngase lentamente de pie mientras se sostiene de algo, hasta que sepa que ha logrado el equilibrio.  Mueva las piernas con frecuencia si debe estar de pie en un lugar durante mucho tiempo. Mientras est de pie, contraiga y relaje los msculos de las  piernas.  Pdale a alguna persona que lo acompae durante 1 o 2das si los mareos siguen siendo un problema. Debe estar acompaado hasta que se sienta lo suficientemente bien como para estar solo. Pdale a la persona que llame al mdico si observa cambios en usted que sean preocupantes.  No conduzca vehculos ni utilice maquinarias pesadas si se siente mareado.  No beba alcohol. SOLICITE ATENCIN MDICA DE INMEDIATO SI:   [place].  Siente nuseas o vomita.  Tiene dificultad para hablar, para caminar o para [employer], las [place] piernas.  Se siente dbil.  No piensa con claridad o tiene dificultades para armar oraciones. Es posible que un amigo o un familiar adviertan que esto ocurre.  Tiene dolor de pecho, dolor abdominal, sudoracin o Risk manager.  Hay cambios en la visin.  Observa un sangrado.  Tiene efectos secundarios del medicamento que parecen Chief Operating Officer de Teacher, English as a foreign language. ASEGRESE DE QUE:   Comprende estas instrucciones.  Controlar su afeccin.  Recibir ayuda de inmediato si no mejora o si empeora. Document Released: 05/21/2005 Document Revised: 05/26/2013 [facility] Patient Information 2015 [place]. This information is not intended to replace advice given to you by your health care provider. Make sure you discuss any questions you have with your health care provider.    Nuseas y Vmitos (Nausea and Vomiting) La nusea es la sensacin de Tree surgeon en el estmago o de la necesidad de vomitar. El vmito es un reflejo por el que los contenidos del estmago salen por  la boca. El vmito puede ocasionar prdida de lquidos del organismo (deshidratacin). Los nios y los [employer] pueden deshidratarse rpidamente (en especial si tambin tienen diarrea). Las nuseas y los vmitos son sntoma de un trastorno o enfermedad. Es importante Energy manager causa de los sntomas. CAUSAS  Irritacin directa de la membrana  que cubre el [place]. Esta irritacin puede ser resultado del aumento de la produccin de cido, (reflujo gastroesofgico), infecciones, intoxicacin alimentaria, ciertos medicamentos (como antinflamatorios no esteroideos), consumo de alcohol o de tabaco.  Seales del cerebro.Estas seales pueden ser un dolor de cabeza, exposicin al calor, trastornos del odo interno, aumento de la presin en el cerebro por lesiones, infeccin, un tumor o conmocin cerebral, estmulos emocionales o problemas metablicos.  Una obstruccin en el tracto gastrointestinal (obstruccin intestinal).  Ciertas enfermedades como la diabetes, problemas en la vescula biliar, apendicitis, problemas renales, cncer, sepsis, sntomas atpicos de infarto o trastornos alimentarios.  Tratamientos mdicos como la quimioterapia y la radiacin.  Medicamentos que inducen al sueo (anestesia general) durante [place]. DIAGNSTICO  El mdico podr solicitarle algunos anlisis si los problemas no mejoran luego de algunos das. Tambin podrn pedirle anlisis si los sntomas son graves o si el motivo de los vmitos o las nuseas no est claro. Los SYSCO ser:   Anlisis de [place].  Anlisis de [place].  Pruebas de materia fecal.  Cultivos (para buscar evidencias de infeccin).  Radiografas u otros estudios por imgenes. Los [employer] de las pruebas lo ayudarn al mdico a tomar decisiones acerca del mejor curso de tratamiento o la necesidad de [employer].  TRATAMIENTO  Debe estar bien hidratado. Beba con frecuencia pequeas cantidades de lquido.Puede beber agua, bebidas deportivas, caldos claros o comer pequeos trocitos de hielo o gelatina para mantenerse hidratado.Cuando coma, hgalo lentamente para evitar las nuseas.Hay medicamentos para evitar las nuseas que pueden aliviarlo.  INSTRUCCIONES PARA EL CUIDADO DOMICILIARIO  Si su mdico le prescribe medicamentos tmelos como se le haya  indicado.  Si no tiene hambre, no se fuerce a comer. Sin embargo, es necesario que tome lquidos.  Si tiene hambre alimntese con una dieta normal, a menos que el mdico le indique otra cosa.  Los mejores alimentos son Ardelia Mems combinacin de carbohidratos complejos (arroz, trigo, papas, pan), carnes magras, yogur, frutas y Photographer.  Evite los alimentos ricos en grasas porque dificultan la digestin.  Beba gran cantidad de lquido para mantener la orina de tono claro o color amarillo plido.  Si est deshidratado, consulte a su mdico para que le d instrucciones especficas para volver a hidratarlo. Los signos de deshidratacin son:  Doristine [place] sed.  Labios y boca secos.  Mareos.  Elmon Else.  Disminucin de la frecuencia y cantidad de la [place].  Confusin.  Tiene el pulso o la respiracin acelerados. SOLICITE ATENCIN MDICA DE INMEDIATO SI:  Vomita sangre o algo similar a la borra del caf.  La materia fecal (heces) es negra o tiene [place].  Sufre una cefalea grave o rigidez en el cuello.  Se siente confundido.  Siente dolor abdominal intenso.  Tiene dolor en el pecho o dificultad para respirar.  No orina por 8 horas.  Tiene la piel fra y pegajosa.  Sigue vomitando durante ms de 24 a 48 horas.  Tiene fiebre. ASEGRESE QUE:   Comprende estas instrucciones.  Controlar su enfermedad.  Solicitar ayuda inmediatamente si no mejora o si empeora. Document Released: 06/10/2007 Document Revised: 08/13/2011 [facility] Patient Information 2015 [place]. This information is not intended to replace  advice given to you by your health care provider. Make sure you discuss any questions you have with your health care provider.

## 2014-07-06 NOTE — ED Notes (Signed)
Pt ambulated with stand by assistance. Dr. Regenia Skeeter witnessed ambulation.

## 2014-07-06 NOTE — ED Notes (Signed)
Patient transported to MRI 

## 2014-07-06 NOTE — ED Notes (Signed)
Pt c/o emesis since Sunday, pt has been unable to eat food since then, emesis with any fluid. Denies abdominal pain, diarrhea.

## 2014-07-08 LAB — URINE CULTURE
Colony Count: NO GROWTH
Culture: NO GROWTH

## 2015-03-14 ENCOUNTER — Ambulatory Visit: Payer: Self-pay | Admitting: Internal Medicine

## 2015-08-15 ENCOUNTER — Other Ambulatory Visit: Payer: Self-pay

## 2015-11-08 ENCOUNTER — Other Ambulatory Visit: Payer: Self-pay | Admitting: Gastroenterology

## 2015-11-08 DIAGNOSIS — R1011 Right upper quadrant pain: Secondary | ICD-10-CM

## 2015-11-08 DIAGNOSIS — R110 Nausea: Secondary | ICD-10-CM

## 2015-11-16 ENCOUNTER — Encounter (HOSPITAL_COMMUNITY)
Admission: RE | Admit: 2015-11-16 | Discharge: 2015-11-16 | Disposition: A | Discharge status: Home / Self Care | Payer: Commercial Managed Care - PPO | Source: Ambulatory Visit | Attending: Gastroenterology | Admitting: Gastroenterology

## 2015-11-16 DIAGNOSIS — R110 Nausea: Secondary | ICD-10-CM | POA: Diagnosis present

## 2015-11-16 DIAGNOSIS — R1011 Right upper quadrant pain: Secondary | ICD-10-CM | POA: Insufficient documentation

## 2015-11-16 MED ORDER — TECHNETIUM TC 99M MEBROFENIN IV KIT
5.0000 | PACK | Freq: Once | INTRAVENOUS | Status: AC | PRN
  Administered 2015-11-16: 5 via INTRAVENOUS

## 2015-11-18 ENCOUNTER — Ambulatory Visit (INDEPENDENT_AMBULATORY_CARE_PROVIDER_SITE_OTHER): Payer: Commercial Managed Care - PPO | Admitting: Internal Medicine

## 2015-11-18 VITALS — BP 132/80 | HR 74 | Temp 98.0°F | Resp 16 | Ht 60.0 in | Wt 225.0 lb

## 2015-11-18 DIAGNOSIS — S39012A Strain of muscle, fascia and tendon of lower back, initial encounter: Secondary | ICD-10-CM

## 2015-11-18 DIAGNOSIS — S0093XA Contusion of unspecified part of head, initial encounter: Secondary | ICD-10-CM

## 2015-11-18 DIAGNOSIS — S161XXA Strain of muscle, fascia and tendon at neck level, initial encounter: Secondary | ICD-10-CM

## 2015-11-18 MED ORDER — CYCLOBENZAPRINE HCL 10 MG PO TABS: 10.0000 mg | ORAL_TABLET | Freq: Every day | ORAL | Status: DC

## 2015-11-18 MED ORDER — DICLOFENAC SODIUM 75 MG PO TBEC: 75.0000 mg | DELAYED_RELEASE_TABLET | Freq: Two times a day (BID) | ORAL | Status: DC

## 2015-11-18 NOTE — Patient Instructions (Addendum)
Apply heating pad to the sore muscles for 20-30 and it will relax them    IF you received an x-ray today, you will receive an invoice from [facility]. Please contact [facility] at [phone] with questions or concerns regarding your invoice.   IF you received labwork today, you will receive an invoice from [employer]. Please contact [facility] at [phone] with questions or concerns regarding your invoice.   Our billing staff will not be able to assist you with questions regarding bills from these companies.  You will be contacted with the lab results as soon as they are available. The fastest way to get your results is to activate your My Chart account. Instructions are located on the last page of this paperwork. If you have not heard from [place] regarding the results in 2 weeks, please contact this office.
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
## 2015-12-19 ENCOUNTER — Other Ambulatory Visit: Payer: Self-pay | Admitting: Surgery

## 2015-12-23 ENCOUNTER — Ambulatory Visit (INDEPENDENT_AMBULATORY_CARE_PROVIDER_SITE_OTHER): Payer: Commercial Managed Care - PPO | Admitting: Urgent Care

## 2015-12-23 VITALS — BP 122/70 | HR 77 | Temp 98.5°F | Resp 16 | Ht 60.0 in | Wt 217.0 lb

## 2015-12-23 DIAGNOSIS — R05 Cough: Secondary | ICD-10-CM

## 2015-12-23 DIAGNOSIS — R519 Headache, unspecified: Secondary | ICD-10-CM

## 2015-12-23 DIAGNOSIS — J019 Acute sinusitis, unspecified: Secondary | ICD-10-CM

## 2015-12-23 DIAGNOSIS — H9203 Otalgia, bilateral: Secondary | ICD-10-CM | POA: Diagnosis not present

## 2015-12-23 DIAGNOSIS — J029 Acute pharyngitis, unspecified: Secondary | ICD-10-CM | POA: Diagnosis not present

## 2015-12-23 DIAGNOSIS — R059 Cough, unspecified: Secondary | ICD-10-CM

## 2015-12-23 DIAGNOSIS — R51 Headache: Secondary | ICD-10-CM | POA: Diagnosis not present

## 2015-12-23 DIAGNOSIS — R0981 Nasal congestion: Secondary | ICD-10-CM | POA: Diagnosis not present

## 2015-12-23 MED ORDER — PSEUDOEPHEDRINE HCL 60 MG PO TABS: 60.0000 mg | ORAL_TABLET | Freq: Three times a day (TID) | ORAL | Status: DC | PRN

## 2015-12-23 MED ORDER — HYDROCODONE-HOMATROPINE 5-1.5 MG/5ML PO SYRP: 5.0000 mL | ORAL_SOLUTION | Freq: Every evening | ORAL | Status: DC | PRN

## 2015-12-23 MED ORDER — CETIRIZINE HCL 10 MG PO TABS: 10.0000 mg | ORAL_TABLET | Freq: Every day | ORAL | Status: DC

## 2015-12-23 MED ORDER — BENZONATATE 100 MG PO CAPS: 100.0000 mg | ORAL_CAPSULE | Freq: Three times a day (TID) | ORAL | Status: DC | PRN

## 2015-12-23 NOTE — Progress Notes (Signed)
    MRN: [id number] DOB: 25-Jan-1961  Subjective:   Marcia Mccoy is a 55 y.o. female presenting for chief complaint of Headache  Reports 4 day history of intermittent frontal headache, fever (highest temp was 101F), bilateral ear pain, sore throat, nasal congestion, dry cough, nausea without vomiting. Has not tried any medications for relief. Patient is currently awaiting authorization for cholecystectomy, has intermittent belly pain as well. Denies history of allergies, smoking cigarettes.  Marcia Mccoy has a current medication list which includes the following prescription(s): cyclobenzaprine, diclofenac, furosemide, meclizine, nitroglycerin, pantoprazole, and diphenhydramine hcl. Also has No Known Allergies.  Marcia Mccoy  has a past medical history of CHF (congestive heart failure) ([place]) and Hypertension. Also  has past surgical history that includes Tubal ligation and Cesarean section.  Objective:   Vitals: BP 122/70 mmHg  Pulse 77  Temp(Src) 98.5 F (36.9 C) (Oral)  Resp 16  Ht 5' (1.524 m)  Wt 217 lb (98.431 kg)  BMI 42.38 kg/m2  SpO2 96%  Physical Exam  Constitutional: She is oriented to person, place, and time. She appears well-developed and well-nourished.  HENT:  TM's intact bilaterally, no effusions or erythema. Nasal turbinates slightly erythematous with yellow mucus, nasal passages patent. Bilateral maxillary sinus tenderness. Oropharynx clear, mucous membranes moist, dentition in good repair.  Eyes: Right eye exhibits no discharge. Left eye exhibits no discharge. No scleral icterus.  Neck: Normal range of motion. Neck supple.  Cardiovascular: Normal rate, regular rhythm and intact distal pulses.  Exam reveals no gallop and no friction rub.   No murmur heard. Pulmonary/Chest: No respiratory distress. She has no wheezes. She has no rales.  Lymphadenopathy:    She has no cervical adenopathy.  Neurological: She is alert and oriented to person, place, and time.  Skin: Skin is  warm and dry.   Assessment and Plan :   1. Acute sinusitis, recurrence not specified, unspecified location 2. Ear pain, bilateral 3. Headache, unspecified headache type 4. Cough 5. Sore throat 6. Nasal congestion - Likely undergoing viral sinusitis, recommended supportive care. Patient to call on Monday if no improvement, consider recheck or antibiotic course if no improvement while using all medications recommended.  Jaynee Eagles, PA-C [facility] [phone] 12/23/2015 2:07 PM

## 2015-12-23 NOTE — Patient Instructions (Signed)

## 2016-05-11 ENCOUNTER — Encounter (HOSPITAL_COMMUNITY): Payer: Self-pay

## 2016-05-11 ENCOUNTER — Ambulatory Visit (INDEPENDENT_AMBULATORY_CARE_PROVIDER_SITE_OTHER): Payer: Commercial Managed Care - PPO | Admitting: Urgent Care

## 2016-05-11 ENCOUNTER — Emergency Department (HOSPITAL_COMMUNITY): Payer: Commercial Managed Care - PPO

## 2016-05-11 ENCOUNTER — Emergency Department (HOSPITAL_COMMUNITY)
Admission: EM | Admit: 2016-05-11 | Discharge: 2016-05-11 | Disposition: A | Discharge status: Home / Self Care | Payer: Commercial Managed Care - PPO | Attending: Emergency Medicine | Admitting: Emergency Medicine

## 2016-05-11 VITALS — BP 118/58 | HR 70 | Temp 98.2°F | Resp 16 | Ht 60.0 in | Wt 224.0 lb

## 2016-05-11 DIAGNOSIS — I110 Hypertensive heart disease with heart failure: Secondary | ICD-10-CM | POA: Diagnosis not present

## 2016-05-11 DIAGNOSIS — I509 Heart failure, unspecified: Secondary | ICD-10-CM

## 2016-05-11 DIAGNOSIS — R0789 Other chest pain: Secondary | ICD-10-CM | POA: Diagnosis not present

## 2016-05-11 DIAGNOSIS — I10 Essential (primary) hypertension: Secondary | ICD-10-CM | POA: Diagnosis not present

## 2016-05-11 DIAGNOSIS — R079 Chest pain, unspecified: Secondary | ICD-10-CM | POA: Diagnosis not present

## 2016-05-11 LAB — COMPREHENSIVE METABOLIC PANEL
ALBUMIN: 3.7 g/dL (ref 3.5–5.0)
ALT: 24 U/L (ref 14–54)
ANION GAP: 10 (ref 5–15)
AST: 22 U/L (ref 15–41)
Alkaline Phosphatase: 82 U/L (ref 38–126)
BUN: 26 mg/dL — ABNORMAL HIGH (ref 6–20)
CO2: 26 mmol/L (ref 22–32)
Calcium: 9.3 mg/dL (ref 8.9–10.3)
Chloride: 105 mmol/L (ref 101–111)
Creatinine, Ser: 0.86 mg/dL (ref 0.44–1.00)
GFR calc non Af Amer: >60 mL/min (ref >60)
Glucose, Bld: 97 mg/dL (ref 65–99)
POTASSIUM: 4.3 mmol/L (ref 3.5–5.1)
Sodium: 141 mmol/L (ref 135–145)
Total Bilirubin: 0.4 mg/dL (ref 0.3–1.2)
Total Protein: 6.8 g/dL (ref 6.5–8.1)

## 2016-05-11 LAB — CBC WITH DIFFERENTIAL/PLATELET
BASOS PCT: 0 %
Basophils Absolute: 0 10*3/uL (ref 0.0–0.1)
Eosinophils Absolute: 0.3 10*3/uL (ref 0.0–0.7)
Eosinophils Relative: 4 %
HEMATOCRIT: 39.7 % (ref 36.0–46.0)
Hemoglobin: 13.3 g/dL (ref 12.0–15.0)
Lymphocytes Relative: 27 %
Lymphs Abs: 2 10*3/uL (ref 0.7–4.0)
MCH: 28.8 pg (ref 26.0–34.0)
MCHC: 33.5 g/dL (ref 30.0–36.0)
MCV: 85.9 fL (ref 78.0–100.0)
MONO ABS: 0.5 10*3/uL (ref 0.1–1.0)
MONOS PCT: 7 %
NEUTROS ABS: 4.6 10*3/uL (ref 1.7–7.7)
Neutrophils Relative %: 62 %
Platelets: 200 10*3/uL (ref 150–400)
RBC: 4.62 MIL/uL (ref 3.87–5.11)
RDW: 13.3 % (ref 11.5–15.5)
WBC: 7.4 10*3/uL (ref 4.0–10.5)

## 2016-05-11 LAB — I-STAT TROPONIN, ED
TROPONIN I, POC: 0 ng/mL (ref 0.00–0.08)
TROPONIN I, POC: 0 ng/mL (ref 0.00–0.08)

## 2016-05-11 MED ORDER — NITROGLYCERIN 0.3 MG SL SUBL
0.4000 mg | SUBLINGUAL_TABLET | SUBLINGUAL | Status: DC | PRN
  Administered 2016-05-11: 0.3 mg via SUBLINGUAL

## 2016-05-11 MED ORDER — ASPIRIN 81 MG PO CHEW: 324.0000 mg | CHEWABLE_TABLET | Freq: Once | ORAL | Status: DC

## 2016-05-11 MED ORDER — NITROGLYCERIN 0.3 MG SL SUBL
0.4000 mg | SUBLINGUAL_TABLET | Freq: Once | SUBLINGUAL | Status: AC
  Administered 2016-05-11: 0.4 mg via SUBLINGUAL

## 2016-05-11 MED ORDER — NITROGLYCERIN 0.4 MG SL SUBL: 0.4000 mg | SUBLINGUAL_TABLET | SUBLINGUAL | 2 refills | Status: DC | PRN

## 2016-05-11 MED ORDER — NITROGLYCERIN 0.4 MG SL SUBL
0.4000 mg | SUBLINGUAL_TABLET | SUBLINGUAL | 5 refills | Status: DC | PRN
Start: 2016-05-11 — End: 2016-05-11

## 2016-05-11 NOTE — ED Notes (Signed)
Pt stable, understands discharge instructions, and reasons for return.   

## 2016-05-11 NOTE — Patient Instructions (Addendum)
Angina de pecho (Angina Pectoris) La angina de pecho, a menudo llamada simplemente angina, es una sensacin de [employer] pecho, el cuello o el brazo, causada por la falta de sangre en la capa media y ms gruesa de la pared del corazn (miocardio). Hay cuatro tipos de angina de pecho.  Angina estable. La frecuencia y la duracin de los episodios de angina estable son predecibles. Por lo general, la causa es la actividad fsica, el estrs emocional o la excitacin. La angina estable suele durar unos pocos minutos y a menudo se [place] tomando un medicamento que se coloca debajo de la lengua, denominado nitroglicerina sublingual.  Angina inestable. La angina inestable puede ocurrir aun cuando el cuerpo realiza poco o ningn esfuerzo fsico, e incluso mientras duerme o descansa. Puede aumentar de repente en gravedad o frecuencia. No se puede aliviar con nitroglicerina sublingual y puede durar hasta 30 minutos.  Angina microvascular. Este tipo de angina se produce por un trastorno de los vasos sanguneos diminutos llamados arteriolas. Es ms comn en las mujeres. El dolor puede ser ms intenso y durar ms que otros tipos de angina de pecho.  Angina de Prinzmetal o angina variante. Este tipo de angina de pecho es poco frecuente y por lo general ocurre cuando realiza poco o ningn esfuerzo fsico. Museum/gallery exhibitions officer sobre todo en las primeras horas de la [place]. CAUSAS La ateroesclerosis causa la angina. Es la acumulacin de grasa y Research officer, trade union (placa) en el interior de las arterias. Con el tiempo, la Emergency planning/management officer u obstruir la arteria y esto reducir el flujo sanguneo al corazn. La placa tambin puede debilitarse y desprenderse en una arteria coronaria para formar un cogulo y causar una obstruccin repentina. [place] riesgo comunes a hombres y mujeres incluyen lo siguiente:  Niveles elevados de colesterol.  Presin arterial elevada (hipertensin).  Consumo de  tabaco.  Diabetes.  Antecedentes familiares de angina.  Obesidad.  La falta de actividad fsica.  Una dieta con alto contenido de grasas saturadas. Las mujeres presentan un riesgo ms alto de tener angina si:  Tienen de 51 aos.  Estn en la posmenopausia. SNTOMAS Muchas personas no presentan sntomas durante las primeras etapas de la angina. A medida que la afeccin progresa, los sntomas comunes a hombres y mujeres pueden incluir lo siguiente:  Tourist information centre manager.  Se describe como un dolor que aplasta o retuerce el pecho, o una opresin, presin, distensin o Production designer, theatre/television/film.  El dolor puede durar ms de unos minutos o puede detenerse y Location manager a Sports administrator.  Dolor en los brazos, el cuello, la mandbula o la espalda.  Acidez estomacal o empacho sin causa aparente.  Falta de aire.  Nuseas.  Sudor fro repentino.  Sensacin repentina de desvanecimiento. Muchas mujeres tienen [employer] pecho y algunos de los otros sntomas. Sin embargo, a menudo Neurosurgeon (atpicos), como:  Fatiga.  Sensacin inexplicable de nerviosismo o ansiedad.  Debilidad sin causa aparente.  Mareos o [employer]. En ocasiones, las mujeres pueden tener angina sin presentar sntomas. [place] los estudios para diagnosticar la angina se incluyen los siguientes:  ECG (electrocardiograma).  Prueba de esfuerzo. Se utiliza para detectar signos de obstruccin cuando el corazn se somete a ejercicio.  Prueba de Financial controller. Se utiliza para detectar signos de obstruccin cuando el corazn se somete a IT consultant uso de un medicamento.  Anlisis de [place].  Angiografa coronaria. En este procedimiento se detecta si hay obstruccin  en las arterias coronarias. TRATAMIENTO El tratamiento de la angina puede incluir lo siguiente:  Adquirir hbitos saludables para reducir o Chief Technology Officer los factores de [place].  Medicamentos.  Colocacin de  stent de arteria coronaria.Un stent ayuda a mantener abierta una arteria.  Angioplastia coronaria. En este procedimiento se ensancha una arteria estrechada u obstruida.  Ciruga de revascularizacin arterial coronaria. Esta permitir que la sangre pase la obstruccin (revascularizacin) para llegar al corazn. [place] los medicamentos solamente como se lo haya indicado el mdico.  No tome los siguientes medicamentos a menos que el mdico lo autorice:  Antiinflamatorios no esteroides (AINE), como ibuprofeno, naproxeno o celecoxib.  Suplementos vitamnicos que contienen vitamina A, vitamina E o ambas.  Tratamientos de reposicin hormonal que contienen estrgeno con o sin progestina.  Controle otros problemas de [place], como hipertensin y diabetes, como se lo haya indicado su mdico.  Siga una dieta cardiosaludable. El nutricionista puede darle instrucciones sobre opciones de alimentos saludables y los cambios correspondientes.  Use mtodos de coccin saludables, como asar, Interior and spatial designer, hervir, hornear, cocer al vapor o IT consultant. Hable con un nutricionista para aprender ms acerca de los mtodos de coccin saludables.  Siga un programa de actividad fsica aprobado por el mdico.  Mantenga un peso saludable. Baje de peso segn se lo indique el mdico.  Planifique perodos de descanso cuando se sienta fatigado.  Aprenda a Engineer, maintenance (IT).  No consuma ningn producto que contenga tabaco, lo que incluye cigarrillos, tabaco de Higher education careers adviser o Psychologist, sport and exercise. Si necesita ayuda para dejar de fumar, consulte al mdico.  Si bebe alcohol y su mdico lo aprueba, limite la ingesta a no ms de 1 medida por da. Una medida equivale a 12onzas de cerveza, 5onzas de vino o 1onzas de bebidas alcohlicas de alta graduacin.  No consuma drogas.  Concurra a todas las visitas de control como se lo haya indicado el mdico. Esto es importante. SOLICITE ATENCIN  MDICA DE [employer] SI:  Siente dolor en el pecho, el cuello, el brazo, la [place], el estmago o la espalda que dure ms de unos pocos minutos, el dolor es recurrente o no se Engineer, production con nitroglicerina sublingual.  Tiene sudoraciones intensas sin causa.  Tiene estos sntomas sin causa aparente:  Acidez estomacal o empacho.  Falta de aire o dificultades respiratorias.  Nuseas o vmitos.  Fatiga.  Nerviosismo o ansiedad.  Debilidad.  Diarrea.  Mareos o sensacin de desvanecimiento repentinos.  Se desmaya. Estos sntomas pueden representar un problema grave que constituye Engineer, maintenance (IT). No espere hasta que los sntomas desaparezcan. Solicite atencin mdica de inmediato. Comunquese con el servicio de emergencias de su localidad (911 en los [place]). No conduzca por sus propios medios Principal Financial.  Esta informacin no tiene Marine scientist el consejo del mdico. Asegrese de hacerle al mdico cualquier pregunta que tenga. Document Released: 05/21/2005 Document Revised: 06/11/2014 Document Reviewed: 09/22/2013 Elsevier Interactive Patient Education  2017 [place] (Heart Failure) La insuficiencia cardaca es una afeccin en la que el corazn tiene dificultad para bombear la [place]. El corazn no bombea sangre de [place] eficiente para que el organismo pueda funcionar bien. En algunos casos de insuficiencia cardaca, los lquidos vuelven a los pulmones, o puede haber hinchazn (edema) en la zona inferior de las piernas. La insuficiencia cardaca por lo general es una enfermedad de larga duracin (crnica). Es importante que se cuide mucho y que siga el plan de tratamiento que  le indique su mdico. CAUSAS  Algunas enfermedades y condiciones pueden causar insuficiencia cardaca. Estas incluyen lo siguiente:  Presin arterial elevada (hipertensin). La hipertensin hace que el msculo cardaco trabaje ms de lo normal. Cuando la  presin en los vasos sanguneos es alta, el corazn tiene que bombear (contraerse) con ms fuerza para Armed forces technical officer la sangre por todo el cuerpo. La hipertensin arterial hace que con el tiempo el corazn se vuelva rgido y dbil.  Enfermedad arterial coronaria (EAC). La enfermedad arterial coronaria es la acumulacin de colesterol y grasa (placa) en las arterias del corazn. La obstruccin de las arterias priva al msculo cardaco de sangre y oxgeno. Esto ocasiona dolor en el pecho y puede conducir a un infarto. La hipertensin arterial tambin favorece la enfermedad arterial coronaria.  Ataque cardaco (infarto de miocardio). El ataque cardaco se produce cuando se obstruye una o ms arterias del corazn. La prdida de oxgeno daa el tejido muscular cardaco. Cuando esto ocurre, una parte del msculo cardaco muere. El tejido daado no se contrae bien y Public house manager la capacidad del corazn para Chiropodist.  Vlvulas cardacas anormales. Cuando las vlvulas cardacas no se abren y cierran como corresponde, pueden causar insuficiencia cardaca. Esto hace que el msculo cardaco tenga que bombear con ms intensidad para que la [place].  Enfermedad del msculo cardaco (miocardiopata o miocarditis). En esta enfermedad, el msculo cardaco est daado por diversas causas. Entre ellas se encuentran el consumo de alcohol o drogas, las infecciones o pueden ser causas desconocidas. Todas estas causas aumentan el riesgo de insuficiencia cardaca.  Enfermedad pulmonar. Enfermedad pulmonar que hace que el corazn se esfuerce ms porque los pulmones no funcionan correctamente. Esto puede hacer que el corazn se tensione, y causar insuficiencia.  Diabetes. La diabetes aumenta el riesgo de insuficiencia cardaca. El nivel elevado de azcar en la sangre contribuye a aumentar los [place] de grasas (lpidos) en la [place]. La diabetes tambin favorece que lentamente se daen los pequeos vasos sanguneos que  transportan nutrientes importantes al el msculo cardaco. Cuando el corazn no obtiene oxgeno y alimento suficiente, se debilita y se torna rgido. Por lo tanto no se contrae de Set designer.  Hay otras enfermedades y condiciones que pueden contribuir a la insuficiencia cardaca. Entre ellas se incluyen el ritmo cardaco anormal, los problemas de tiroides y los recuentos bajos de glbulos rojos (anemia). Determinadas conductas perjudiciales aumentan el riesgo de insuficiencia cardaca, por ejemplo:  Tener sobrepeso.  Fumar o mascar tabaco.  Consumir alimentos ricos en grasas y colesterol.  Abusar de las drogas ilcitas o del alcohol.  La falta de actividad fsica. SNTOMAS  Los sntomas pueden variar y ser difciles de Hydrographic surveyor. Los sntomas pueden incluir:  Falta de aire al realizar actividades como subir escaleras.  Tos persistente.  Hinchazn de los pies, tobillos, piernas o abdomen.  Aumento de peso sin motivo.  Dificultad para respirar al estar acostado (ortopnea).  Despertarse con la necesidad de sentarse y tomar aire.  Latidos cardacos rpidos.  Fatiga y prdida de Teacher, early years/pre.  Mareos, o sensacin de desmayo o desvanecimiento.  Prdida del apetito.  Nuseas.  Orina con ms frecuencia durante la noche (nocturia). DIAGNSTICO  El diagnstico se realiza segn la historia clnica, los sntomas, el examen fsico y las pruebas diagnsticas. Las pruebas diagnsticas son:  Lawyer.  Electrocardiograma.  Radiografa de trax.  Anlisis de [place].  Prueba de esfuerzo.  Angiografa cardaca.  Gammagrafa. TRATAMIENTO  El tratamiento est destinado al control de los sntomas. Podr ser necesario  que tome medicamentos, realice cambios en su estilo de vida o se someta a una intervencin [place] para tratar la insuficiencia cardaca.  Los medicamentos para el tratamiento de la insuficiencia cardaca son:  Inhibidores de la enzima convertidora de  angiotensina (IECA). Este tipo de [employer] bloquea los efectos de una protena llamada enzima convertidora de angiotensina. Los inhibidores de la ECA Engineer, agricultural (dilatan) los vasos sanguneos y ayudan a Equities trader la presin arterial.  Bloqueadores de los receptores de angiotensina [place]). Este tipo de medicamento bloquea las acciones de una protena de la sangre llamada angiotensina. Los bloqueadores de los receptores de angiotensina dilatan los vasos sanguneos y [place] a reducir la presin arterial.  Medicamentos para eliminar los lquidos (diurticos). Los diurticos [employer] los riones eliminen la sal y el agua de la [place]. El lquido en exceso es eliminado con la [place]. Esta eliminacin del exceso de lquido disminuye el volumen de sangre que el corazn bombea.  Betabloqueantes. Los betabloqueantes evitan que el corazn palpite demasiado rpido y mejoran la fuerza del msculo cardaco.  Digitlicos. Aumentan la fuerza de los latidos cardacos.  Los [employer] un estilo de vida saludable incluyen:  Science writer y Theatre manager un peso saludable.  Dejar de fumar o mascar tabaco.  Consumir alimentos cardiosaludables.  Limitar o evitar el alcohol.  Dejar de consumir drogas ilcitas.  Hacer actividad fsica segn las indicaciones de su mdico.  Los tratamientos quirrgicos para la insuficiencia cardaca pueden ser:  Un procedimiento para abrir las arterias obstruidas, reparacin de vlvulas cardacas daadas o la extirpacin del tejido muscular cardaco daado.  La colocacin de un marcapasos para ayudar al funcionamiento del msculo cardaco y para controlar ciertos ritmos cardacos anormales.  Un desfibrilador cardioversor interno para tratar determinados ritmos cardacos graves anormales.  Un dispositivo de asistencia ventricular izquierda (DAVI) para ayudar a que el corazn [employer]. [place] los medicamentos solamente como se lo haya  indicado el mdico. Los medicamentos son importantes para reducir la carga de trabajo del corazn, disminuir la progresin de la insuficiencia cardaca y Teacher, English as a foreign language los sntomas.  No deje de tomar los medicamentos, excepto si se lo indica su mdico.  No se saltee ninguna dosis de los medicamentos.  Pida una nueva receta antes de quedarse sin medicamentos. Es necesario que tome sus medicamentos todos [place].  Haga actividad fsica moderada si se lo indica su mdico. La actividad fsica moderada puede beneficiar a Psychologist, clinical. Los ancianos y las personas con insuficiencia cardaca grave deben consultarle a su mdico qu actividades fsicas son recomendables para ellos.  Consuma alimentos cardiosaludables. Los alimentos no deben incluir grasas trans y deben tener bajo contenido de grasas saturadas, colesterol y sal (sodio). Las opciones saludables incluyen frutas frescas o congeladas y verduras, pescado, carnes magras, legumbres, productos lcteos descremados o bajos en grasa y cereales integrales o alimentos con alto contenido de [place]. Hable con un nutricionista para aprender ms sobre los alimentos cardiosaludables.  Limite el sodio si se lo indica su mdico. La restriccin de sodio puede reducir los sntomas de insuficiencia cardaca en [employer]. Hable con un nutricionista para aprender ms sobre los condimentos cardiosaludables.  Use mtodos de coccin saludables. Los mtodos de coccin saludables incluyen asar, Interior and spatial designer, hervir, Teacher, music, cocer al vapor o IT consultant. Hable con un nutricionista para aprender ms acerca de los mtodos de coccin saludables.  Limite los lquidos si se lo indica su mdico. La restriccin de lquidos puede reducir los sntomas de insuficiencia  cardaca en algunas personas.  Controle su peso a diario. Es importante que se pese todos los das para reconocer anticipadamente cuando hay exceso de lquido. Hgalo todas las maanas despus de orinar y antes de  Merchandiser, retail. Use la misma ropa cada vez que se pese. Anote su [employer]. Lleve a su mdico el registro de su peso.  Controle y registre su presin arterial si se lo indica su mdico.  Contrlese el pulso si se lo indica su mdico.  Pierda peso si se lo indica su mdico. La prdida de peso puede reducir los sntomas de insuficiencia cardaca en Psychologist, clinical.  Deje de fumar o mascar tabaco. La nicotina hace que el corazn trabaje ms y [employer] vasos sanguneos. No utilice chicles ni parches de [place] antes de hablar con su mdico.  Concurra a todas las visitas de control como se lo haya indicado el mdico. Esto es importante.  Limite el consumo de alcohol a no ms de 1 medida por da en las mujeres no embarazadas y 2 medidas en los hombres. Una medida equivale a 12onzas de cerveza, 5onzas de vino o 1onzas de bebidas alcohlicas de alta graduacin. [place] ms puede ser daino para el corazn. Informe a su mdico si bebe alcohol varias veces a la semana. Hable con su mdico acerca de si el alcohol es seguro para usted. Si el corazn ya ha sufrido un dao por el alcohol o sufre una insuficiencia cardaca grave, debe dejar de consumirlo completamente.  Deje de consumir drogas ilcitas.  [place]. Esto es especialmente importante prevenir las infecciones respiratorias con vacunas actualizadas contra el neumococo y la gripe.  Controle otros problemas de [place], como hipertensin, diabetes, enfermedad de la tiroides o ritmos cardacos anormales segn las indicaciones de su mdico.  Aprenda a Engineer, maintenance (IT).  Planifique perodos de descanso cuando se sienta fatigado.  Aprenda estrategias para manejar las altas temperaturas. Si el clima es extremadamente caluroso:  Evite la actividad fsica intensa.  Use el aire acondicionado o los ventiladores o busque un lugar ms fresco.  Evite la cafena y el alcohol.  Use ropa holgada, ligera y de colores  claros.  Aprenda estrategias para manejar el fro. Si el clima es extremadamente fro:  Evite la actividad fsica intensa.  Vstase con varias capas de ropa.  Use mitones o guantes, un sombrero y [place] bufanda cuando salga.  Evite el alcohol.  Reciba asesoramiento y 29 si lo necesita.  Busque programas de rehabilitacin y participe en ellos para mantener o mejorar su independencia y su calidad de vida. SOLICITE ATENCIN MDICA SI:   Aumenta de peso rpidamente.  Nota que le falta el aire y esto no es habitual en usted.  No puede participar en sus actividades fsicas habituales.  Se cansa con facilidad.  Tose ms de lo normal, especialmente al realizar actividad fsica.  Observa que sus manos, pies, tobillos o abdomen se le hinchan o estn ms hinchados que lo habitual.  Le cuesta dormir debido a que Film/video editor.  Siente que el corazn late rpido (palpitaciones).  Siente mareos o sensacin de desvanecimiento al ponerse de pie. SOLICITE ATENCIN MDICA DE INMEDIATO SI:   Tiene dificultad para respirar.  Hay una modificacin en el estado mental, como disminucin de la lucidez o dificultad para concentrarse.  Siente dolor o Adult nurse.  Se desmaya una vez (sncope). ASEGRESE DE QUE:   Comprende estas instrucciones.  Controlar su afeccin.  Recibir ayuda de inmediato si no mejora o si empeora. Esta informacin no tiene Marine scientist el consejo del mdico. Asegrese de hacerle al mdico cualquier pregunta que tenga. Document Released: 05/21/2005 Document Revised: 09/12/2015 Elsevier Interactive Patient Education  2017 [employer].

## 2016-05-11 NOTE — Discharge Instructions (Signed)
If you continue to have symptoms, we would recommend that your primary care doctor refer you to Cardiology for possible stress test.

## 2016-05-11 NOTE — ED Triage Notes (Signed)
Pt. Coming from [facility] for chest pain for the past 4 days. Today around 1300, patient had episode of nausea, SOB, and diaphoresis with her CP. Pt. Seen by urgent care and given 324 ASA and 2 nitro with relief of pain. Pt. Hx of angina and takes nitro at home for chest pain. Pt. 12 lead from urgent care unremarkable. Pt. Pain free. Pt. Hx of CHF.

## 2016-05-11 NOTE — Progress Notes (Signed)
    MRN: [id number] DOB: 01/11/1961  Subjective:   Marcia Mccoy is a 55 y.o. female with pmh of heart failure, HTN presenting for chief complaint of Chest Pain (x 4 days) and Medication Refill (Nitroglycerin)  Reports 4 day history of chest pain. Pain is mid-left sided, constant since the morning today, sharp in nature, radiates to upper left arm. Chest pain is rated 8/10. Chest pain is associated with shob, diaphoresis this morning, had vomiting 3 days ago. She is out of nitroglycerin. She has had dyspnea while walking at work this week including exertional chest pain relieved with rest. Denies fever, abdominal pain, neck pain, jaw pain, cough.  Marcia Mccoy has a current medication list which includes the following prescription(s): furosemide, meclizine, nitroglycerin, omeprazole, and ondansetron, and the following Facility-Administered Medications: nitroglycerin. Also has No Known Allergies.  Marcia Mccoy  has a past medical history of CHF (congestive heart failure) ([place]) and Hypertension. Also  has a past surgical history that includes Tubal ligation and Cesarean section.  Objective:   Vitals: BP 114/70 (BP Location: Right Arm, Patient Position: Sitting, Cuff Size: Large)   Pulse 70   Temp 98.2 F (36.8 C)   Resp 16   Ht 5' (1.524 m)   Wt 224 lb (101.6 kg)   SpO2 98%   BMI 43.75 kg/m   BP was 122/62 s/p SL 0.4mg  nitroglycerin. After patient's 2nd SL 0.4mg  nitroglycerin, patient rates her chest pain at 3/10, still constant.  Physical Exam  Constitutional: She is oriented to person, place, and time. She appears well-developed and well-nourished.  HENT:  Mouth/Throat: Oropharynx is clear and moist.  Eyes: No scleral icterus.  Cardiovascular: Normal rate, regular rhythm and intact distal pulses.  Exam reveals no gallop and no friction rub.   No murmur heard. Pulmonary/Chest: No respiratory distress. She has no wheezes. She has no rales.  Abdominal: Soft. Bowel sounds are normal. She  exhibits no distension and no mass. There is no tenderness. There is no guarding.  Neurological: She is alert and oriented to person, place, and time.  Skin: Skin is warm and dry.   ECG interpretation - T-wave inversion in V1-V4, T-wave flattening in V6.  Assessment and Plan :   1. Chest pain, unspecified type 2. Congestive heart failure, unspecified congestive heart failure chronicity, unspecified congestive heart failure type ([place]) 3. Essential hypertension - Patient has unstable angina, not resolved with SL nitroglycerin. Will send by EMS for emergent management. I reported case to [place] Nurse who verbalized understanding. I called patient's daughter, Marcia Mccoy, at [phone] and reported her mother's treatment plan.   Jaynee Eagles, PA-C [facility] [phone] 05/11/2016 3:43 PM

## 2016-05-11 NOTE — ED Provider Notes (Signed)
[facility] DEPT Provider Note   CSN: [id number] Arrival date & time: 05/11/16  1730   History   Chief Complaint Chief Complaint  Patient presents with  . Chest Pain    HPI Marcia Mccoy is a 55 y.o. female presenting to the ED with chest pain. Her chest pain started 4 days ago when she was walking at work. She thought this felt like her typical chest pain. 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 The chest pain was associated with nausea, shortness of breath, and diaphoresis. She then went to [facility], where they did an EKG, which was unremarkable per report. She was given ASA 324mg  and 2 nitroglycerin and her chest pain resolved 30-45 minutes later. She states her chest pain has been constant and is "sharp" in nature. The chest pain is located in the center of her chest. The chest pain is sometimes worse with walking, but sometimes not. The chest pain resolved after Nitro and ASA 324. The chest pain is also better when she pushes down on her chest. The pain radiates to her left shoulder. She has associated diffuse weakness that started today.   She does not have any personal history of CAD. Her family history is significant for a maternal uncle with an MI in his 21s. Her mother also passed away from an MI at age 28.  HPI  Past Medical History:  Diagnosis Date  . CHF (congestive heart failure) ([place])   . Hypertension     Patient Active Problem List   Diagnosis Date Noted  . BREAST PAIN, LEFT 08/01/2010  . PELVIC PAIN, LEFT 08/01/2010  . HYPERCHOLESTEROLEMIA, MILD 12/27/2009  . KNEE PAIN, LEFT 12/27/2009  . 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 CHEST PAIN, ATYPICAL 09/02/2009  . RECTAL BLEEDING 12/16/2008  . MORBID OBESITY 11/09/2008  . VARICOSE VEINS, LOWER EXTREMITIES 11/09/2008  . BACK PAIN, ACUTE 11/09/2008  . PLANTAR FASCIITIS, BILATERAL 11/09/2008  . PERIPHERAL  EDEMA 11/09/2008  . CARPAL TUNNEL SYNDROME 01/28/2007  . ARTHRITIS, KNEE 01/28/2007  . DEPRESSION 01/27/2007    Past Surgical History:  Procedure Laterality Date  . CESAREAN SECTION    . TUBAL LIGATION      OB History    No data available       Home Medications    Prior to Admission medications   Medication Sig Start Date End Date Taking? Authorizing Provider  furosemide (LASIX) 40 MG tablet Take 40 mg by mouth daily.    Historical Provider, MD  meclizine (ANTIVERT) 25 MG tablet Take 1 tablet (25 mg total) by mouth 4 (four) times daily as needed for dizziness or nausea. 07/06/14   Sherwood Gambler, MD  nitroGLYCERIN (NITROSTAT) 0.4 MG SL tablet Place 1 tablet (0.4 mg total) under the tongue every 5 (five) minutes as needed for chest pain. 05/11/16   Jaynee Eagles, PA-C  omeprazole (PRILOSEC) 40 MG capsule Take 40 mg by mouth daily. 11/27/15   Historical Provider, MD  ondansetron (ZOFRAN-ODT) 4 MG disintegrating tablet Take 1 tablet by mouth daily. 11/02/15   Historical Provider, MD    Family History Maternal uncle- had an MI in his 43s Mother- died of MI at age 73  Social History Social History  Substance Use Topics  . Smoking status: Never Smoker  . Smokeless tobacco: Never Used  . Alcohol use No     Allergies   Patient has no known  allergies.   Review of Systems Review of Systems 10 Systems reviewed and are negative for acute change except as noted in the HPI.  Physical Exam Updated Vital Signs BP 125/57 (BP Location: Left Arm)   Pulse 60   Resp 16   Ht 5' (1.524 m)   Wt 101.6 kg   SpO2 98%   BMI 43.75 kg/m   Physical Exam  Constitutional: She appears well-developed and well-nourished. No distress.  HENT:  Head: Normocephalic and atraumatic.  Eyes: Conjunctivae and EOM are normal. Pupils are equal, round, and reactive to light.  Neck: Normal range of motion. Neck supple. No JVD present.  Cardiovascular: Normal rate and regular rhythm.   No murmur  heard. Pain reproducible on palpation of the sternum  Pulmonary/Chest: Effort normal and breath sounds normal. No respiratory distress.  Abdominal: Soft. There is no tenderness.  Musculoskeletal: Normal range of motion. She exhibits no edema or tenderness.  Lymphadenopathy:    She has no cervical adenopathy.  Neurological: She is alert. She exhibits normal muscle tone.  Skin: Skin is warm and dry.  Psychiatric: She has a normal mood and affect.  Nursing note and vitals reviewed.    ED Treatments / Results  Labs (all labs ordered are listed, but only abnormal results are displayed) Labs Reviewed  CBC WITH DIFFERENTIAL/PLATELET  COMPREHENSIVE METABOLIC PANEL  I-STAT TROPOININ, ED    EKG  EKG Interpretation  Date/Time:  Friday May 11 2016 17:46:40 EST Ventricular Rate:  62 PR Interval:    QRS Duration: 85 QT Interval:  425 QTC Calculation: 432 R Axis:   66 Text Interpretation:  Sinus rhythm Low voltage, precordial leads Nonspecific T abnormalities, anterior leads Baseline wander in lead(s) I II aVR aVL No significant change since last tracing Confirmed by YAO  MD, DAVID ([id number]) on 05/11/2016 5:53:58 PM       Radiology No results found.  Procedures Procedures (including critical care time)  Medications Ordered in ED Medications - No data to display   Initial Impression / Assessment and Plan / ED Course  I have reviewed the triage vital signs and the nursing notes.  Pertinent labs & imaging results that were available during my care of the patient were reviewed by me and considered in my medical decision making (see chart for details).  Clinical Course    Marcia Mccoy is a 55 year old female presenting to the ED from [facility] with chest pain for the last 4 days, which acutely worsened today at 1:30PM. She does have some features concerning for ACS, including pain that "sometimes" worsens with walking, pain that resolved with Nitro, and pain associated with  nausea, diaphoresis, and shortness of breath. She also has other features that are not typical of cardiac etiology, including pain that is reproducible on palpation of the sternum. Heart score is a 3.   7:15PM: Initial labs/studies returned and were unremarkable. Trop negative. EKG with non-specific T wave inversions, unchanged from previous EKG. CXR negative. Will get a delta troponin. Anticipate discharge home if negative.  9:00PM: Repeat troponin 0.00. Pt is safe for discharge home.  Final Clinical Impressions(s) / ED Diagnoses   Final diagnoses:  None    New Prescriptions New Prescriptions   No medications on file     Sela Hua, MD 05/11/16 2104    Drenda Freeze, MD 05/11/16 2308

## 2016-05-12 LAB — BRAIN NATRIURETIC PEPTIDE: BNP: 25.2 pg/mL (ref 0.0–100.0)

## 2016-06-07 ENCOUNTER — Ambulatory Visit (INDEPENDENT_AMBULATORY_CARE_PROVIDER_SITE_OTHER): Payer: Commercial Managed Care - PPO | Admitting: Urgent Care

## 2016-06-07 VITALS — BP 146/90 | HR 77 | Temp 97.7°F | Resp 18 | Ht 60.0 in | Wt 224.0 lb

## 2016-06-07 DIAGNOSIS — R0602 Shortness of breath: Secondary | ICD-10-CM

## 2016-06-07 DIAGNOSIS — J069 Acute upper respiratory infection, unspecified: Secondary | ICD-10-CM | POA: Diagnosis not present

## 2016-06-07 DIAGNOSIS — R0789 Other chest pain: Secondary | ICD-10-CM

## 2016-06-07 DIAGNOSIS — B9789 Other viral agents as the cause of diseases classified elsewhere: Secondary | ICD-10-CM | POA: Diagnosis not present

## 2016-06-07 DIAGNOSIS — Z8679 Personal history of other diseases of the circulatory system: Secondary | ICD-10-CM | POA: Diagnosis not present

## 2016-06-07 MED ORDER — CETIRIZINE HCL 10 MG PO TABS: 10.0000 mg | ORAL_TABLET | Freq: Every day | ORAL | 11 refills | Status: DC

## 2016-06-07 MED ORDER — BENZONATATE 100 MG PO CAPS: 100.0000 mg | ORAL_CAPSULE | Freq: Three times a day (TID) | ORAL | 0 refills | Status: DC | PRN

## 2016-06-07 MED ORDER — HYDROCODONE-HOMATROPINE 5-1.5 MG/5ML PO SYRP: 5.0000 mL | ORAL_SOLUTION | Freq: Every evening | ORAL | 0 refills | Status: DC | PRN

## 2016-06-07 NOTE — Patient Instructions (Addendum)
Infeccin del tracto respiratorio superior, adultos (Upper Respiratory Infection, Adult) La mayora de las infecciones del tracto respiratorio superior son infecciones virales de las vas que llevan el aire a los pulmones. Un infeccin del tracto respiratorio superior afecta la nariz, la garganta y las vas respiratorias superiores. El tipo ms frecuente de infeccin del tracto respiratorio superior es la nasofaringitis, que habitualmente se conoce como "resfro comn". Las infecciones del tracto respiratorio superior siguen su curso y por lo general se curan solas. En la mayora de los casos, la infeccin del tracto respiratorio superior no requiere atencin mdica, pero a veces, despus de una infeccin viral, puede surgir una infeccin bacteriana en las vas respiratorias superiores. Esto se conoce como infeccin secundaria. Las infecciones sinusales y en el odo medio son tipos frecuentes de infecciones secundarias en el tracto respiratorio superior. La neumona bacteriana tambin puede complicar un cuadro de infeccin del tracto respiratorio superior. Este tipo de infeccin puede empeorar el asma y la enfermedad pulmonar obstructiva crnica (EPOC). En algunos casos, estas complicaciones pueden requerir atencin mdica de emergencia y poner en peligro la vida. CAUSAS Casi todas las infecciones del tracto respiratorio superior se deben a los virus. Un virus es un tipo de microbio que puede contagiarse de una persona a otra. FACTORES DE RIESGO Puede estar en riesgo de sufrir una infeccin del tracto respiratorio superior si:  Fuma.  Tiene una enfermedad pulmonar o cardaca crnica.  Tiene debilitado el sistema de defensa (inmunitario) del cuerpo.  Es muy joven o de edad muy avanzada.  Tiene asma o alergias nasales.  Trabaja en reas donde hay mucha gente o poca ventilacin.  Trabaja en una escuela o en un centro de atencin mdica. SIGNOS Y SNTOMAS Habitualmente, los sntomas aparecen de  2a 3das despus de entrar en contacto con el virus del resfro. La mayora de las infecciones virales en el tracto respiratorio superior duran de 7a 10das. Sin embargo, las infecciones virales en el tracto respiratorio superior a causa del virus de la gripe pueden durar de 14a 18das y, habitualmente, son ms graves. Entre los sntomas se pueden incluir los siguientes:  Secrecin o congestin nasal.  Estornudos.  Tos.  Dolor de garganta.  Dolor de cabeza.  Fatiga.  Fiebre.  Prdida del apetito.  Dolor en la frente, detrs de los ojos y por encima de los pmulos (dolor sinusal).  Dolores musculares. DIAGNSTICO El mdico puede diagnosticar una infeccin del tracto respiratorio superior mediante los siguientes estudios:  Examen fsico.  Pruebas para verificar si los sntomas no se deben a otra afeccin, por ejemplo:  Faringitis estreptoccica.  Sinusitis.  Neumona.  Asma. TRATAMIENTO Esta infeccin desaparece sola, con el tiempo. No puede curarse con medicamentos, pero a menudo se prescriben para aliviar los sntomas. Los medicamentos pueden ser tiles para lo siguiente:  Bajar la fiebre.  Reducir la tos.  Aliviar la congestin nasal. INSTRUCCIONES PARA EL CUIDADO EN EL HOGAR  Tome los medicamentos solamente como se lo haya indicado el mdico.  A fin de aliviar el dolor de garganta, haga grgaras con solucin salina templada o consuma caramelos para la tos, como se lo haya indicado el mdico.  Use un humidificador de vapor clido o inhale el vapor de la ducha para aumentar la humedad del aire. Esto facilitar la respiracin.  Beba suficiente lquido para mantener la orina clara o de color amarillo plido.  Consuma sopas y otros caldos transparentes, y alimntese bien.  Descanse todo lo que sea necesario.  Regrese al trabajo cuando   la temperatura se le haya normalizado o cuando el mdico lo autorice. Es posible que deba quedarse en su casa durante un  tiempo prolongado, para no infectar a los dems. [place] usar un barbijo y lavarse las manos con cuidado para Mining engineer propagacin del virus.  Aumente el uso del inhalador si tiene asma.  No consuma ningn producto que contenga tabaco, lo que incluye cigarrillos, tabaco de Higher education careers adviser o Psychologist, sport and exercise. Si necesita ayuda para dejar de fumar, consulte al [employer]. PREVENCIN La mejor manera de protegerse de un resfro es mantener una higiene [place].  Evite el contacto oral o fsico con personas que tengan sntomas de resfro.  En caso de contacto, lvese las manos con frecuencia. No hay pruebas claras de que la vitaminaC, la vitaminaE, la equincea o el ejercicio reduzcan la probabilidad de Museum/gallery curator un resfro. Sin embargo, siempre se recomienda Scientific laboratory technician, hacer ejercicio y Ecologist. SOLICITE ATENCIN MDICA SI:  Su estado empeora en lugar de mejorar.  Los medicamentos no Animator.  Tiene escalofros.  La sensacin de falta de aire empeora.  Tiene mucosidad marrn o roja.  Tiene secrecin nasal amarilla o marrn.  Le duele la cara, especialmente al inclinarse hacia adelante.  Tiene fiebre.  Tiene los ganglios del cuello hinchados.  Siente dolor al tragar.  Tiene zonas blancas en la parte de atrs de la garganta. SOLICITE ATENCIN MDICA DE INMEDIATO SI:  Tiene sntomas intensos o persistentes de:  Dolor de [place].  Dolor de odos.  Dolor sinusal.  Dolor en el pecho.  Tiene enfermedad pulmonar crnica y cualquiera de estos sntomas:  Sibilancias.  Tos prolongada.  Tos con sangre.  Cambio en la mucosidad habitual.  Presenta rigidez en el cuello.  Tiene cambios en:  La visin.  La audicin.  El pensamiento.  El [place] de nimo. ASEGRESE DE QUE:  Comprende estas instrucciones.  Controlar su afeccin.  Recibir ayuda de inmediato si no mejora o si empeora. Esta informacin no tiene Hydrologist el consejo del mdico. Asegrese de hacerle al mdico cualquier pregunta que tenga. Document Released: 02/28/2005 Document Revised: 10/05/2014 Document Reviewed: 08/26/2013 Elsevier Interactive Patient Education  2017 [place] en los adultos (Cough, Adult) La tos es un reflejo que limpia la garganta y las vas respiratorias, y ayuda a la curacin y Health visitor de los pulmones. Es normal toser de Engineer, civil (consulting), pero cuando esta se presenta con otros sntomas o dura mucho tiempo puede ser el signo de una enfermedad que [place]. La tos puede durar solo 2 o 3semanas (aguda) o ms de 8semanas (crnica). CAUSAS Comnmente, las causas de la tos son las siguientes:  Advice worker sustancias que [employer]  Una infeccin respiratoria viral o bacteriana.  Alergias.  Asma.  Goteo posnasal.  Fumar.  El retroceso de cido estomacal hacia el esfago (reflujo gastroesofgico).  Algunos medicamentos.  Los problemas pulmonares crnicos, entre ellos, la enfermedad pulmonar obstructiva crnica (EPOC) (o, en contadas ocasiones, el cncer de pulmn).  Otras afecciones, como la insuficiencia cardaca. INSTRUCCIONES PARA EL CUIDADO EN EL HOGAR Est atento a cualquier cambio en los sntomas. Tome estas medidas para Public house manager las molestias:  Tome los medicamentos solamente como se lo haya indicado el mdico.  Si le recetaron un antibitico, tmelo como se lo haya indicado el mdico. No deje de tomar los antibiticos aunque comience a sentirse mejor.  Hable con el mdico antes de tomar un antitusivo.  Beba suficiente lquido  para mantener la orina clara o de color amarillo plido.  Si el aire est seco, use un vaporizador o un humidificador con vapor fro en su habitacin o en su casa para ayudar a aflojar las secreciones.  Evite todas las cosas que le producen tos en el trabajo o en su casa.  Si la tos aumenta durante la noche, intente dormir  semisentado.  Evite el humo del cigarrillo. Si fuma, deje de hacerlo. Si necesita ayuda para dejar de fumar, consulte al mdico.  Evite la cafena.  Evite el alcohol.  Descanse todo lo que sea necesario. SOLICITE ATENCIN MDICA SI:  Aparecen nuevos sntomas.  Expectora pus al toser.  La tos no mejora despus de 2 o 3semanas, o empeora.  No puede controlar la tos con antitusivos y no puede dormir bien.  Tiene un dolor que se intensifica o que no puede Research scientist (life sciences).  Tiene fiebre.  Baja de peso sin causa aparente.  Tiene transpiracin nocturna. SOLICITE ATENCIN MDICA DE INMEDIATO SI:  Tose y escupe sangre.  Tiene dificultad para respirar.  Los latidos cardacos son muy rpidos. Esta informacin no tiene Marine scientist el consejo del mdico. Asegrese de hacerle al mdico cualquier pregunta que tenga. Document Released: 12/27/2010 Document Revised: 02/09/2015 Document Reviewed: 07/28/2014 Elsevier Interactive Patient Education  2017 [employer].    IF you received an x-ray today, you will receive an invoice from [facility]. Please contact [facility] at [phone] with questions or concerns regarding your invoice.   IF you received labwork today, you will receive an invoice from [facility]. Please contact [facility] at [phone] with questions or concerns regarding your invoice.   Our billing staff will not be able to assist you with questions regarding bills from these companies.  You will be contacted with the lab results as soon as they are available. The fastest way to get your results is to activate your My Chart account. Instructions are located on the last page of this paperwork. If you have not heard from [place] regarding the results in 2 weeks, please contact this office.

## 2016-06-07 NOTE — Progress Notes (Signed)
    MRN: [id number] DOB: September 12, 1960  Subjective:   Marcia Mccoy is a 56 y.o. female presenting for chief complaint of Cough (Productive)  Reports 2 day history of productive cough. Cough has elicited has elicited chest pain and shob. Has also had subjective, nausea, scratchy and sore throat, sinus headache, bilateral ear pain. Denies sinus pain, congestion, abdominal pain, rashes. Has tried Alka-Seltzer cold and flu, Delsym. Admits history of heart failure, has been sleeping more elevated in the past week. Denies lower leg edema, dyspnea, chest pain not associated with her cough, hemoptysis. Denies history of asthma, allergies. Denies smoking cigarettes.   [name] has a current medication list which includes the following prescription(s): furosemide and nitroglycerin. Also has No Known Allergies.  [name]  has a past medical history of CHF (congestive heart failure) ([place]) and Hypertension. Also  has a past surgical history that includes Tubal ligation and Cesarean section.  Objective:   Vitals: BP (!) 146/90   Pulse 77   Temp 97.7 F (36.5 C) (Oral)   Resp 18   Ht 5' (1.524 m)   Wt 224 lb (101.6 kg)   SpO2 98%   BMI 43.75 kg/m   BP Readings from Last 3 Encounters:  06/07/16 (!) 146/90  05/11/16 124/62  05/11/16 (!) 118/58    Physical Exam  Constitutional: She is oriented to person, place, and time. She appears well-developed and well-nourished.  HENT:  TM's flat bilaterally but no effusions or erythema. Nasal turbinates slightly erythematous, dry with thick yellow mucus. No sinus tenderness. Postnasal drip present but without oropharyngeal exudates, erythema or abscesses.  Eyes: No scleral icterus.  Neck: Normal range of motion. Neck supple.  Cardiovascular: Normal rate, regular rhythm and intact distal pulses.  Exam reveals no gallop and no friction rub.   No murmur heard. Pulmonary/Chest: No respiratory distress. She has no wheezes. She has no rales.  Musculoskeletal: She  exhibits no edema.  Lymphadenopathy:    She has cervical adenopathy (bilateral).  Neurological: She is alert and oriented to person, place, and time.  Skin: Skin is warm and dry.   Assessment and Plan :   1. Viral URI with cough 2. Atypical chest pain 3. Shortness of breath 4. History of CHF (congestive heart failure) - BNP pending. I suspect viral syndrome, advised supportive care. If no improvement or symptoms do not resolve return to clinic in 4-5 days. Counseled on signs and symptoms suggestive of heart failure, patient will rtc if this develops. Patient is in agreement.  Jaynee Eagles, PA-C [facility] [phone] 06/07/2016 4:45 PM

## 2016-06-08 ENCOUNTER — Telehealth: Payer: Self-pay

## 2016-06-08 LAB — BRAIN NATRIURETIC PEPTIDE: BNP: 13.6 pg/mL (ref 0.0–100.0)

## 2016-06-08 NOTE — Telephone Encounter (Signed)
Patient documents (AVS, Hycodan prescription, Work Note, prescription/ allergies list) were found in the parking lot. Pt was seen 06/07/16. Called and [place] for patient to come in to pick up these documents.

## 2016-06-11 ENCOUNTER — Encounter: Payer: Self-pay | Admitting: Urgent Care

## 2016-10-23 DIAGNOSIS — R1030 Lower abdominal pain, unspecified: Secondary | ICD-10-CM | POA: Diagnosis not present

## 2016-10-24 ENCOUNTER — Other Ambulatory Visit: Payer: Self-pay | Admitting: Family Medicine

## 2016-10-24 DIAGNOSIS — R109 Unspecified abdominal pain: Secondary | ICD-10-CM

## 2016-11-08 ENCOUNTER — Other Ambulatory Visit: Payer: Self-pay | Admitting: Family Medicine

## 2016-11-19 DIAGNOSIS — R600 Localized edema: Secondary | ICD-10-CM | POA: Diagnosis not present

## 2016-11-19 DIAGNOSIS — R079 Chest pain, unspecified: Secondary | ICD-10-CM | POA: Diagnosis not present

## 2017-04-28 DIAGNOSIS — J019 Acute sinusitis, unspecified: Secondary | ICD-10-CM | POA: Diagnosis not present

## 2017-05-09 ENCOUNTER — Ambulatory Visit
Admission: RE | Admit: 2017-05-09 | Discharge: 2017-05-09 | Disposition: A | Discharge status: Home / Self Care | Payer: Commercial Managed Care - PPO | Source: Ambulatory Visit | Attending: Nurse Practitioner | Admitting: Nurse Practitioner

## 2017-05-09 ENCOUNTER — Other Ambulatory Visit: Payer: Self-pay | Admitting: Nurse Practitioner

## 2017-05-09 DIAGNOSIS — R1011 Right upper quadrant pain: Secondary | ICD-10-CM

## 2017-05-09 DIAGNOSIS — K7689 Other specified diseases of liver: Secondary | ICD-10-CM | POA: Diagnosis not present

## 2017-05-09 DIAGNOSIS — R109 Unspecified abdominal pain: Secondary | ICD-10-CM | POA: Diagnosis not present

## 2017-05-16 ENCOUNTER — Other Ambulatory Visit: Payer: Self-pay | Admitting: Nurse Practitioner

## 2017-05-16 DIAGNOSIS — R932 Abnormal findings on diagnostic imaging of liver and biliary tract: Secondary | ICD-10-CM

## 2017-05-24 ENCOUNTER — Other Ambulatory Visit: Payer: Commercial Managed Care - PPO

## 2017-05-27 DIAGNOSIS — R1011 Right upper quadrant pain: Secondary | ICD-10-CM | POA: Diagnosis not present

## 2017-05-27 DIAGNOSIS — K219 Gastro-esophageal reflux disease without esophagitis: Secondary | ICD-10-CM | POA: Diagnosis not present

## 2017-06-10 ENCOUNTER — Ambulatory Visit
Admission: RE | Admit: 2017-06-10 | Discharge: 2017-06-10 | Disposition: A | Discharge status: Home / Self Care | Payer: Commercial Managed Care - PPO | Source: Ambulatory Visit | Attending: Nurse Practitioner | Admitting: Nurse Practitioner

## 2017-06-10 DIAGNOSIS — R932 Abnormal findings on diagnostic imaging of liver and biliary tract: Secondary | ICD-10-CM

## 2017-06-10 DIAGNOSIS — D175 Benign lipomatous neoplasm of intra-abdominal organs: Secondary | ICD-10-CM | POA: Diagnosis not present

## 2017-06-10 MED ORDER — GADOBENATE DIMEGLUMINE 529 MG/ML IV SOLN
20.0000 mL | Freq: Once | INTRAVENOUS | Status: AC | PRN
  Administered 2017-06-10: 20 mL via INTRAVENOUS

## 2017-06-20 DIAGNOSIS — Z1231 Encounter for screening mammogram for malignant neoplasm of breast: Secondary | ICD-10-CM | POA: Diagnosis not present

## 2017-06-25 DIAGNOSIS — R748 Abnormal levels of other serum enzymes: Secondary | ICD-10-CM | POA: Diagnosis not present

## 2017-06-25 DIAGNOSIS — R1011 Right upper quadrant pain: Secondary | ICD-10-CM | POA: Diagnosis not present

## 2017-06-25 DIAGNOSIS — L918 Other hypertrophic disorders of the skin: Secondary | ICD-10-CM | POA: Diagnosis not present

## 2017-07-01 DIAGNOSIS — R945 Abnormal results of liver function studies: Secondary | ICD-10-CM | POA: Diagnosis not present

## 2017-08-05 DIAGNOSIS — R1011 Right upper quadrant pain: Secondary | ICD-10-CM | POA: Diagnosis not present

## 2017-08-07 ENCOUNTER — Other Ambulatory Visit (HOSPITAL_COMMUNITY): Payer: Self-pay | Admitting: Family Medicine

## 2017-08-07 DIAGNOSIS — R1011 Right upper quadrant pain: Secondary | ICD-10-CM

## 2017-08-15 ENCOUNTER — Encounter (HOSPITAL_COMMUNITY): Payer: Self-pay

## 2017-08-15 ENCOUNTER — Encounter (HOSPITAL_COMMUNITY)
Admission: RE | Admit: 2017-08-15 | Discharge: 2017-08-15 | Disposition: A | Discharge status: Home / Self Care | Payer: Commercial Managed Care - PPO | Source: Ambulatory Visit | Attending: Family Medicine | Admitting: Family Medicine

## 2017-11-01 DIAGNOSIS — R600 Localized edema: Secondary | ICD-10-CM | POA: Diagnosis not present

## 2017-12-13 DIAGNOSIS — R609 Edema, unspecified: Secondary | ICD-10-CM | POA: Diagnosis not present

## 2018-03-07 DIAGNOSIS — B029 Zoster without complications: Secondary | ICD-10-CM | POA: Diagnosis not present

## 2018-11-12 ENCOUNTER — Other Ambulatory Visit: Payer: Self-pay | Admitting: Urgent Care

## 2018-11-12 DIAGNOSIS — Z1231 Encounter for screening mammogram for malignant neoplasm of breast: Secondary | ICD-10-CM

## 2018-12-29 ENCOUNTER — Ambulatory Visit
Admission: RE | Admit: 2018-12-29 | Discharge: 2018-12-29 | Disposition: A | Discharge status: Home / Self Care | Payer: Commercial Managed Care - PPO | Source: Ambulatory Visit | Attending: Urgent Care | Admitting: Urgent Care

## 2018-12-29 ENCOUNTER — Other Ambulatory Visit: Payer: Self-pay

## 2018-12-29 DIAGNOSIS — Z1231 Encounter for screening mammogram for malignant neoplasm of breast: Secondary | ICD-10-CM

## 2019-02-12 ENCOUNTER — Other Ambulatory Visit: Payer: Self-pay | Admitting: Physician Assistant

## 2019-02-12 DIAGNOSIS — R1011 Right upper quadrant pain: Secondary | ICD-10-CM

## 2019-02-20 ENCOUNTER — Emergency Department (HOSPITAL_COMMUNITY)
Admission: EM | Admit: 2019-02-20 | Discharge: 2019-02-20 | Disposition: A | Discharge status: Home / Self Care | Payer: Commercial Managed Care - PPO | Attending: Emergency Medicine | Admitting: Emergency Medicine

## 2019-02-20 ENCOUNTER — Encounter (HOSPITAL_COMMUNITY): Payer: Self-pay

## 2019-02-20 ENCOUNTER — Other Ambulatory Visit: Payer: Self-pay

## 2019-02-20 ENCOUNTER — Emergency Department (HOSPITAL_COMMUNITY): Payer: Commercial Managed Care - PPO

## 2019-02-20 DIAGNOSIS — R197 Diarrhea, unspecified: Secondary | ICD-10-CM

## 2019-02-20 DIAGNOSIS — I110 Hypertensive heart disease with heart failure: Secondary | ICD-10-CM | POA: Diagnosis not present

## 2019-02-20 DIAGNOSIS — R110 Nausea: Secondary | ICD-10-CM | POA: Diagnosis not present

## 2019-02-20 DIAGNOSIS — D485 Neoplasm of uncertain behavior of skin: Secondary | ICD-10-CM | POA: Insufficient documentation

## 2019-02-20 DIAGNOSIS — Z79899 Other long term (current) drug therapy: Secondary | ICD-10-CM | POA: Insufficient documentation

## 2019-02-20 DIAGNOSIS — R1011 Right upper quadrant pain: Secondary | ICD-10-CM | POA: Insufficient documentation

## 2019-02-20 DIAGNOSIS — I509 Heart failure, unspecified: Secondary | ICD-10-CM | POA: Diagnosis not present

## 2019-02-20 LAB — URINALYSIS, ROUTINE W REFLEX MICROSCOPIC
Bilirubin Urine: NEGATIVE
Glucose, UA: NEGATIVE mg/dL
Hgb urine dipstick: NEGATIVE
Ketones, ur: NEGATIVE mg/dL
Leukocytes,Ua: NEGATIVE
Nitrite: NEGATIVE
Protein, ur: NEGATIVE mg/dL
Specific Gravity, Urine: 1.021 (ref 1.005–1.030)
pH: 7 (ref 5.0–8.0)

## 2019-02-20 LAB — I-STAT BETA HCG BLOOD, ED (MC, WL, AP ONLY): I-stat hCG, quantitative: <5 m[IU]/mL (ref <5)

## 2019-02-20 LAB — COMPREHENSIVE METABOLIC PANEL
ALT: 30 U/L (ref 0–44)
AST: 25 U/L (ref 15–41)
Albumin: 4.1 g/dL (ref 3.5–5.0)
Alkaline Phosphatase: 72 U/L (ref 38–126)
Anion gap: 8 (ref 5–15)
BUN: 16 mg/dL (ref 6–20)
CO2: 28 mmol/L (ref 22–32)
Calcium: 9.3 mg/dL (ref 8.9–10.3)
Chloride: 105 mmol/L (ref 98–111)
Creatinine, Ser: 0.92 mg/dL (ref 0.44–1.00)
GFR calc Af Amer: >60 mL/min (ref >60)
GFR calc non Af Amer: >60 mL/min (ref >60)
Glucose, Bld: 100 mg/dL — ABNORMAL HIGH (ref 70–99)
Potassium: 4.2 mmol/L (ref 3.5–5.1)
Sodium: 141 mmol/L (ref 135–145)
Total Bilirubin: 0.5 mg/dL (ref 0.3–1.2)
Total Protein: 7.3 g/dL (ref 6.5–8.1)

## 2019-02-20 LAB — CBC
HCT: 43.4 % (ref 36.0–46.0)
Hemoglobin: 14.1 g/dL (ref 12.0–15.0)
MCH: 28.7 pg (ref 26.0–34.0)
MCHC: 32.5 g/dL (ref 30.0–36.0)
MCV: 88.4 fL (ref 80.0–100.0)
Platelets: 199 10*3/uL (ref 150–400)
RBC: 4.91 MIL/uL (ref 3.87–5.11)
RDW: 13.1 % (ref 11.5–15.5)
WBC: 6.2 10*3/uL (ref 4.0–10.5)
nRBC: 0 % (ref 0.0–0.2)

## 2019-02-20 LAB — LIPASE, BLOOD: Lipase: 40 U/L (ref 11–51)

## 2019-02-20 MED ORDER — SODIUM CHLORIDE 0.9% FLUSH: 3.0000 mL | Freq: Once | INTRAVENOUS | Status: DC

## 2019-02-20 NOTE — Discharge Instructions (Addendum)
The testing today did not show any serious problems.  Make sure you follow-up with the GI doctor next week as scheduled.  For pain use Tylenol 650 mg every 4 hours.

## 2019-02-20 NOTE — ED Provider Notes (Signed)
[facility] DEPT Provider Note   CSN: [id number] Arrival date & time: 02/20/19  1414     History   Chief Complaint Chief Complaint  Patient presents with  . Abdominal Pain    HPI Marcia Mccoy is a 58 y.o. female.     HPI   She resents for evaluation after going to her primary care doctor, to be seen for postprandial right upper quadrant pain with nausea, for 2 weeks.  She has had a 4 liquid bowel movements every day which are brown in color and do not appear to have blood in them.  She feels like her pain is worsening.  She was scheduled for outpatient testing, ultrasound but that was not done.  She denies fever, chills, cough, shortness of breath, weakness or dizziness.  Past Medical History:  Diagnosis Date  . CHF (congestive heart failure) ([place])   . Hypertension     Patient Active Problem List   Diagnosis Date Noted  . BREAST PAIN, LEFT 08/01/2010  . PELVIC PAIN, LEFT 08/01/2010  . HYPERCHOLESTEROLEMIA, MILD 12/27/2009  . KNEE PAIN, LEFT 12/27/2009  . 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 CHEST PAIN, ATYPICAL 09/02/2009  . RECTAL BLEEDING 12/16/2008  . MORBID OBESITY 11/09/2008  . VARICOSE VEINS, LOWER EXTREMITIES 11/09/2008  . BACK PAIN, ACUTE 11/09/2008  . PLANTAR FASCIITIS, BILATERAL 11/09/2008  . PERIPHERAL EDEMA 11/09/2008  . CARPAL TUNNEL SYNDROME 01/28/2007  . ARTHRITIS, KNEE 01/28/2007  . DEPRESSION 01/27/2007    Past Surgical History:  Procedure Laterality Date  . CESAREAN SECTION    . TUBAL LIGATION       OB History   No obstetric history on file.      Home Medications    Prior to Admission medications   Medication Sig Start Date End Date Taking? Authorizing Provider  fluticasone (FLONASE) 50 MCG/ACT nasal spray Place 1 spray into both nostrils daily.   Yes [provider]  lisinopril (ZESTRIL) 5 MG tablet Take 5 mg by mouth daily. 02/19/19  Yes [provider]  montelukast (SINGULAIR) 10 MG tablet Take 10 mg by mouth daily. 02/19/19  Yes [provider]  benzonatate (TESSALON) 100 MG capsule Take 1-2 capsules (100-200 mg total) by mouth 3 (three) times daily as needed for cough. Patient not taking: Reported on 02/20/2019 06/07/16   Jaynee Eagles, PA-C  cetirizine (ZYRTEC) 10 MG tablet Take 1 tablet (10 mg total) by mouth daily. Patient not taking: Reported on 02/20/2019 06/07/16   Jaynee Eagles, PA-C  HYDROcodone-homatropine [facility]) 5-1.5 MG/5ML syrup Take 5 mLs by mouth at bedtime as needed. Patient not taking: Reported on 02/20/2019 06/07/16   Jaynee Eagles, PA-C  nitroGLYCERIN (NITROSTAT) 0.4 MG SL tablet Place 1 tablet (0.4 mg total) under the tongue every 5 (five) minutes as needed for chest pain. 05/11/16   Mayo, Pete Pelt, MD    Family History No family history on file.  Social History Social History   Tobacco Use  . Smoking status: Never Smoker  . Smokeless tobacco: Never Used  Substance Use Topics  . Alcohol use: No  . Drug use: No     Allergies   Patient has no known allergies.   Review of Systems Review of Systems  All other systems reviewed and are negative.    Physical Exam Updated Vital Signs BP (!) 118/59 (BP Location: Right Arm)   Pulse (!) 57   Temp 98.1 F (36.7 C) (Oral)   Resp 18  Wt 104.3 kg   SpO2 98%   BMI 44.92 kg/m   Physical Exam Vitals signs and nursing note reviewed.  Constitutional:      General: She is not in acute distress.    Appearance: She is well-developed. She is obese. She is not ill-appearing or diaphoretic.  HENT:     Head: Normocephalic and atraumatic.     Right Ear: External ear normal.     Left Ear: External ear normal.  Eyes:     Conjunctiva/sclera: Conjunctivae normal.     Pupils: Pupils are equal, round, and reactive to light.  Neck:     Musculoskeletal: Normal range of motion and neck supple.     Trachea: Phonation normal.  Cardiovascular:     Rate and Rhythm:  Normal rate and regular rhythm.     Heart sounds: Normal heart sounds.  Pulmonary:     Effort: Pulmonary effort is normal.     Breath sounds: Normal breath sounds.  Abdominal:     General: There is no distension.     Palpations: Abdomen is soft. There is no mass.     Tenderness: There is abdominal tenderness (Right upper quadrant, moderate). There is no guarding or rebound.  Musculoskeletal: Normal range of motion.  Skin:    General: Skin is warm and dry.  Neurological:     Mental Status: She is alert and oriented to person, place, and time.     Cranial Nerves: No cranial nerve deficit.     Sensory: No sensory deficit.     Motor: No abnormal muscle tone.     Coordination: Coordination normal.  Psychiatric:        Mood and Affect: Mood normal.        Behavior: Behavior normal.        Thought Content: Thought content normal.        Judgment: Judgment normal.      ED Treatments / Results  Labs (all labs ordered are listed, but only abnormal results are displayed) Labs Reviewed  COMPREHENSIVE METABOLIC PANEL - Abnormal; Notable for the following components:      Result Value   Glucose, Bld 100 (*)    All other components within normal limits  LIPASE, BLOOD  CBC  URINALYSIS, ROUTINE W REFLEX MICROSCOPIC  I-STAT BETA HCG BLOOD, ED ([facility], [facility], [facility] ONLY)    EKG None  Radiology US Abdomen Complete  Result Date: 02/20/2019 CLINICAL DATA:  Abdominal pain for 2 weeks EXAM: ABDOMEN ULTRASOUND COMPLETE COMPARISON:  Abdominal dated 11/2016. FINDINGS: Gallbladder: No gallstones or wall thickening visualized. A positive sonographic Percell Miller sign was noted by sonographer. Common bile duct: Diameter: 4 mm Liver: No focal lesion identified. Within normal limits in parenchymal echogenicity. Portal vein is patent on color Doppler imaging with normal direction of blood flow towards the liver. IVC: No abnormality visualized. Pancreas: Visualized portion unremarkable. Spleen: Size and appearance  within normal limits. Right Kidney: Length: 9.2 cm. Echogenicity within normal limits. No mass or hydronephrosis visualized. Left Kidney: Length: 9.9 cm. Echogenicity within normal limits. No mass or hydronephrosis visualized. Abdominal aorta: No aneurysm visualized. Other findings: None. IMPRESSION: Positive sonographic Murphy sign without gallbladder wall thickening or gallstones. This is equivocal for acute cholecystitis. Electronically Signed   By: Zerita Boers M.D.   On: 02/20/2019 16:18    Procedures Procedures (including critical care time)  Medications Ordered in ED Medications  sodium chloride flush (NS) 0.9 % injection 3 mL (3 mLs Intravenous Not Given 02/20/19 1930)  Initial Impression / Assessment and Plan / ED Course  I have reviewed the triage vital signs and the nursing notes.  Pertinent labs & imaging results that were available during my care of the patient were reviewed by me and considered in my medical decision making (see chart for details).  Clinical Course as of Feb 19 2002  Fri Feb 20, 2019  1903 Normal  I-Stat beta hCG blood, ED [EW]  1903 Normal  Lipase, blood [EW]  1903 Normal  CBC [EW]  1903 Normal except glucose trace elevated  Comprehensive metabolic panel(!) [EW]  123456 No acute intra-abdominal processes, by report of radiology.  US Abdomen Complete [EW]  1948 Normal  Urinalysis, Routine w reflex microscopic [EW]    Clinical Course User Index [EW] Daleen Bo, MD        Patient Vitals for the past 24 hrs:  BP Temp Temp src Pulse Resp SpO2 Weight  02/20/19 1916 (!) 118/59 - - (!) 57 18 98 % -  02/20/19 1433 - - - - - - 104.3 kg  02/20/19 1432 126/68 98.1 F (36.7 C) Oral 63 16 98 % -    8:03 PM Reevaluation with update and discussion. After initial assessment and treatment, an updated evaluation reveals patient is comfortable at this time.  She now states that she has a follow-up appointment with GI, next week, on Tuesday.  Findings  discussed and questions answered. Daleen Bo   Medical Decision Making: Apparent recurrent abdominal pain with patient concern for gallbladder disease.  She had MRI and ultrasound imaging done about a year ago, at which time she had a work-up for possible liver abnormality.  At that time there was no gallbladder disease, and the liver abnormality was benign.  Today the evaluation is reassuring.  CRITICAL CARE-no Performed by: Daleen Bo  Nursing Notes Reviewed/ Care Coordinated Applicable Imaging Reviewed Interpretation of Laboratory Data incorporated into ED treatment  The patient appears reasonably screened and/or stabilized for discharge and I doubt any other medical condition or other [facility] requiring further screening, evaluation, or treatment in the ED at this time prior to discharge.  Plan: Home Medications-continue usual; Home Treatments-rest, gradual advance diet; return here if the recommended treatment, does not improve the symptoms; Recommended follow up-GI follow-up next week as scheduled    Final Clinical Impressions(s) / ED Diagnoses   Final diagnoses:  Right upper quadrant abdominal pain  Diarrhea, unspecified type    ED Discharge Orders    None       Daleen Bo, MD 02/20/19 2004

## 2019-02-20 NOTE — ED Notes (Signed)
Pt was verbalized discharge instructions. Pt had no further questions at this time. NAD. 

## 2019-02-20 NOTE — ED Triage Notes (Signed)
Pt was sent from urgent care. Pt was scheduled for [place], but wrong scan was ordered, pt unable to get scan. Pt is having RUQ pain, + Murphy sign. Pt states that her pain is increased after eating. Pt has not been able to eat, mostly only liquids.

## 2019-02-24 ENCOUNTER — Other Ambulatory Visit (HOSPITAL_COMMUNITY): Payer: Self-pay | Admitting: Gastroenterology

## 2019-02-24 ENCOUNTER — Other Ambulatory Visit: Payer: Self-pay | Admitting: Gastroenterology

## 2019-02-24 DIAGNOSIS — R1011 Right upper quadrant pain: Secondary | ICD-10-CM

## 2019-02-25 ENCOUNTER — Other Ambulatory Visit: Payer: Self-pay | Admitting: Physician Assistant

## 2019-02-25 DIAGNOSIS — R1011 Right upper quadrant pain: Secondary | ICD-10-CM

## 2019-03-04 ENCOUNTER — Emergency Department (HOSPITAL_COMMUNITY): Payer: Commercial Managed Care - PPO

## 2019-03-04 ENCOUNTER — Other Ambulatory Visit: Payer: Self-pay

## 2019-03-04 ENCOUNTER — Encounter (HOSPITAL_COMMUNITY): Payer: Self-pay

## 2019-03-04 ENCOUNTER — Emergency Department (HOSPITAL_COMMUNITY)
Admission: EM | Admit: 2019-03-04 | Discharge: 2019-03-04 | Disposition: A | Discharge status: Home / Self Care | Payer: Commercial Managed Care - PPO | Attending: Emergency Medicine | Admitting: Emergency Medicine

## 2019-03-04 DIAGNOSIS — R9431 Abnormal electrocardiogram [ECG] [EKG]: Secondary | ICD-10-CM

## 2019-03-04 DIAGNOSIS — I509 Heart failure, unspecified: Secondary | ICD-10-CM | POA: Insufficient documentation

## 2019-03-04 DIAGNOSIS — R0689 Other abnormalities of breathing: Secondary | ICD-10-CM | POA: Insufficient documentation

## 2019-03-04 DIAGNOSIS — R0789 Other chest pain: Secondary | ICD-10-CM | POA: Diagnosis not present

## 2019-03-04 DIAGNOSIS — I110 Hypertensive heart disease with heart failure: Secondary | ICD-10-CM | POA: Insufficient documentation

## 2019-03-04 DIAGNOSIS — Z79899 Other long term (current) drug therapy: Secondary | ICD-10-CM | POA: Insufficient documentation

## 2019-03-04 HISTORY — DX: Anxiety disorder, unspecified: F41.9

## 2019-03-04 LAB — BASIC METABOLIC PANEL
Anion gap: 7 (ref 5–15)
BUN: 17 mg/dL (ref 6–20)
CO2: 28 mmol/L (ref 22–32)
Calcium: 9.6 mg/dL (ref 8.9–10.3)
Chloride: 104 mmol/L (ref 98–111)
Creatinine, Ser: 0.78 mg/dL (ref 0.44–1.00)
GFR calc Af Amer: >60 mL/min (ref >60)
GFR calc non Af Amer: >60 mL/min (ref >60)
Glucose, Bld: 104 mg/dL — ABNORMAL HIGH (ref 70–99)
Potassium: 4.1 mmol/L (ref 3.5–5.1)
Sodium: 139 mmol/L (ref 135–145)

## 2019-03-04 LAB — CBC
HCT: 42.8 % (ref 36.0–46.0)
Hemoglobin: 14 g/dL (ref 12.0–15.0)
MCH: 28.8 pg (ref 26.0–34.0)
MCHC: 32.7 g/dL (ref 30.0–36.0)
MCV: 88.1 fL (ref 80.0–100.0)
Platelets: 180 10*3/uL (ref 150–400)
RBC: 4.86 MIL/uL (ref 3.87–5.11)
RDW: 12.8 % (ref 11.5–15.5)
WBC: 4.8 10*3/uL (ref 4.0–10.5)
nRBC: 0 % (ref 0.0–0.2)

## 2019-03-04 LAB — I-STAT BETA HCG BLOOD, ED (MC, WL, AP ONLY): I-stat hCG, quantitative: <5 m[IU]/mL (ref <5)

## 2019-03-04 LAB — BRAIN NATRIURETIC PEPTIDE: B Natriuretic Peptide: 25 pg/mL (ref 0.0–100.0)

## 2019-03-04 LAB — TROPONIN I (HIGH SENSITIVITY)
Troponin I (High Sensitivity): <2 ng/L (ref <18)
Troponin I (High Sensitivity): <2 ng/L (ref <18)

## 2019-03-04 MED ORDER — SODIUM CHLORIDE 0.9% FLUSH
3.0000 mL | Freq: Once | INTRAVENOUS | Status: AC
  Administered 2019-03-04: 3 mL via INTRAVENOUS

## 2019-03-04 NOTE — Discharge Instructions (Addendum)
[employer] son normales. Llame al cardiologo, su numero esta en sus papeles llame a Dr. Jacquiline Doe en una semana si es que no la han llamado.   Si tiene dolor de pecho,falta la respiracion o empeora regrese a la sala de [employer].

## 2019-03-04 NOTE — ED Provider Notes (Signed)
  Face-to-face evaluation   History: She presents for evaluation of chest pain rating to left arm which occurred as she woke this morning at 6 AM.  Initially pain was transient after a few minutes, then recurred lasting several hours.  She is additionally had a couple of other short episodes.  Pain is been moderately severe.  I saw the patient at 4:30 PM, at which time she was pain-free.  She has history of hypertension, unknown cholesterol level, and family history positive for heart disease.  Patient is being actively evaluated for right upper quadrant pain, and scheduled to have a nuclear medicine study, tomorrow.  She saw GI last week, to initiate this evaluation.  Physical exam: Obese alert and cooperative.  She appears comfortable.  No respiratory distress.  She is lucid.  Vital signs at 1530-pulse 56, respiratory rate 24, blood pressure 105/52.  Medical decision making-nonspecific chest pain, relatively low cardiac risk.  Chest pain is atypical for coronary disease, and I suspect GI as a source.  Patient has follow-up scheduled with cardiology, we contacted them today to get an appointment within the next 1 to 2 weeks.  Medical screening examination/treatment/procedure(s) were conducted as a shared visit with non-physician practitioner(s) and myself.  I personally evaluated the patient during the encounter    Daleen Bo, MD 03/05/19 [phone]

## 2019-03-04 NOTE — ED Notes (Signed)
Pt daughter at bedside

## 2019-03-04 NOTE — ED Triage Notes (Signed)
Patient states she began having mid chest pain and nausea at 0600 today, but none now. Patient states she went to an UC and was sent to the ED for an abnormal EKG.

## 2019-03-04 NOTE — ED Notes (Signed)
Pt verbalizes understanding of DC instructions. Pt belongings returned and is ambulatory out of ED.  

## 2019-03-04 NOTE — ED Provider Notes (Signed)
[facility] DEPT Provider Note   CSN: [id number] Arrival date & time: 03/04/19  1204     History   Chief Complaint Chief Complaint  Patient presents with  . Abnormal ECG  . Chest Pain    HPI Marcia Mccoy is a 58 y.o. female.     58 y.o female with a PMH of CHF, HTN presents to the ED with a chief complaint of chest pain x this morning. Patient reports she was sleeping when a sudden onset of sharp pressure to the center of her chest woke her up, reports this pressure radiated down her left arm, she felt her left arm go stiff.She states the pain is intermittent, there are no alleviating factors or exacerbating factors. Patient has not taken any medication for relieve in symptoms. She reports a similar episode several years ago, has been seen in the past for cardiologist Dr. Tamala Julian but unsure on results of her evaluation. She also Dors is increased fatigue for the past couple of days.  Of note, patient does have a previous echo in 2011 ordered by Dr. Tamala Julian.  She reports some nausea, currently has a nuclear medicine study ordered in an outpatient basis.  Denies any vomiting, shortness of breath, other complaints.  The history is provided by the patient and medical records. No language interpreter was used.  Chest Pain Associated symptoms: abdominal pain   Associated symptoms: no back pain, no fever, no headache, no nausea, no palpitations, no shortness of breath and no vomiting     Past Medical History:  Diagnosis Date  . Anxiety   . CHF (congestive heart failure) ([place])   . Hypertension     Patient Active Problem List   Diagnosis Date Noted  . BREAST PAIN, LEFT 08/01/2010  . PELVIC PAIN, LEFT 08/01/2010  . HYPERCHOLESTEROLEMIA, MILD 12/27/2009  . KNEE PAIN, LEFT 12/27/2009  . 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 CHEST PAIN, ATYPICAL 09/02/2009  . RECTAL BLEEDING 12/16/2008  . MORBID OBESITY 11/09/2008  . VARICOSE  VEINS, LOWER EXTREMITIES 11/09/2008  . BACK PAIN, ACUTE 11/09/2008  . PLANTAR FASCIITIS, BILATERAL 11/09/2008  . PERIPHERAL EDEMA 11/09/2008  . CARPAL TUNNEL SYNDROME 01/28/2007  . ARTHRITIS, KNEE 01/28/2007  . DEPRESSION 01/27/2007    Past Surgical History:  Procedure Laterality Date  . CESAREAN SECTION    . TUBAL LIGATION       OB History   No obstetric history on file.      Home Medications    Prior to Admission medications   Medication Sig Start Date End Date Taking? Authorizing Provider  benzonatate (TESSALON) 100 MG capsule Take 1-2 capsules (100-200 mg total) by mouth 3 (three) times daily as needed for cough. Patient not taking: Reported on 02/20/2019 06/07/16   Jaynee Eagles, PA-C  cetirizine (ZYRTEC) 10 MG tablet Take 1 tablet (10 mg total) by mouth daily. Patient not taking: Reported on 02/20/2019 06/07/16   Jaynee Eagles, PA-C  fluticasone [facility]) 50 MCG/ACT nasal spray Place 1 spray into both nostrils daily.    [provider]  HYDROcodone-homatropine (HYCODAN) 5-1.5 MG/5ML syrup Take 5 mLs by mouth at bedtime as needed. Patient not taking: Reported on 02/20/2019 06/07/16   Jaynee Eagles, PA-C  lisinopril (ZESTRIL) 5 MG tablet Take 5 mg by mouth daily. 02/19/19   [provider]  montelukast (SINGULAIR) 10 MG tablet Take 10 mg by mouth daily. 02/19/19   [provider]  nitroGLYCERIN (NITROSTAT) 0.4 MG SL tablet Place  1 tablet (0.4 mg total) under the tongue every 5 (five) minutes as needed for chest pain. 05/11/16   Mayo, Pete Pelt, MD    Family History Family History  Problem Relation Age of Onset  . Diabetes Mother   . Hypertension Mother   . Heart failure Mother     Social History Social History   Tobacco Use  . Smoking status: Never Smoker  . Smokeless tobacco: Never Used  Substance Use Topics  . Alcohol use: No  . Drug use: No     Allergies   Patient has no known allergies.   Review of Systems Review of Systems   Constitutional: Negative for fever.  HENT: Negative for sinus pain and sore throat.   Respiratory: Negative for shortness of breath.   Cardiovascular: Positive for chest pain. Negative for palpitations and leg swelling.  Gastrointestinal: Positive for abdominal pain. Negative for diarrhea, nausea and vomiting.  Genitourinary: Negative for flank pain.  Musculoskeletal: Negative for back pain.  Skin: Negative for pallor and wound.  Neurological: Negative for light-headedness and headaches.     Physical Exam Updated Vital Signs BP 124/72   Pulse (!) 52   Temp 97.7 F (36.5 C) (Oral)   Resp (!) 24   Ht 5' (1.524 m)   Wt 99.8 kg   SpO2 100%   BMI 42.97 kg/m   Physical Exam Vitals signs and nursing note reviewed.  Constitutional:      General: She is not in acute distress.    Appearance: She is well-developed. She is not ill-appearing or toxic-appearing.  HENT:     Head: Normocephalic and atraumatic.  Eyes:     Pupils: Pupils are equal, round, and reactive to light.  Cardiovascular:     Rate and Rhythm: Bradycardia present.  Pulmonary:     Effort: Pulmonary effort is normal.     Breath sounds: Decreased breath sounds present. No wheezing or rhonchi.  Chest:     Chest wall: No tenderness.  Abdominal:     General: Bowel sounds are normal.     Palpations: Abdomen is soft.     Tenderness: There is abdominal tenderness.     Comments: TTP on RUQ. Bowel sounds present.   Musculoskeletal:     Right lower leg: She exhibits no tenderness.     Left lower leg: She exhibits no tenderness.  Skin:    General: Skin is warm and dry.  Neurological:     Mental Status: She is alert and oriented to person, place, and time.      ED Treatments / Results  Labs (all labs ordered are listed, but only abnormal results are displayed) Labs Reviewed  BASIC METABOLIC PANEL - Abnormal; Notable for the following components:      Result Value   Glucose, Bld 104 (*)    All other components  within normal limits  CBC  BRAIN NATRIURETIC PEPTIDE  I-STAT BETA HCG BLOOD, ED ([facility], [facility], [facility] ONLY)  TROPONIN I (HIGH SENSITIVITY)  TROPONIN I (HIGH SENSITIVITY)    EKG EKG Interpretation  Date/Time:  Wednesday March 04 2019 12:21:45 EDT Ventricular Rate:  57 PR Interval:    QRS Duration: 80 QT Interval:  414 QTC Calculation: 404 R Axis:   43 Text Interpretation:  Sinus rhythm Low voltage, precordial leads Borderline repolarization abnormality Baseline wander in lead(s) V4 since last tracing no significant change Confirmed by Daleen Bo [id number]) on 03/04/2019 2:49:17 PM   Radiology Dg Chest 2 View  Result Date: 03/04/2019  CLINICAL DATA:  Mid chest pain, nausea EXAM: CHEST - 2 VIEW COMPARISON:  05/11/2016 FINDINGS: The heart size and mediastinal contours are within normal limits. Both lungs are clear. Degenerative changes within the spine and shoulders. IMPRESSION: No active cardiopulmonary disease. Electronically Signed   By: Davina Poke M.D.   On: 03/04/2019 13:21    Procedures Procedures (including critical care time)  Medications Ordered in ED Medications  sodium chloride flush (NS) 0.9 % injection 3 mL (3 mLs Intravenous Given 03/04/19 1327)     Initial Impression / Assessment and Plan / ED Course  I have reviewed the triage vital signs and the nursing notes.  Pertinent labs & imaging results that were available during my care of the patient were reviewed by me and considered in my medical decision making (see chart for details).      Patient with a past medical history of hypertension presents to the ED with complaints of sudden onset of chest pain this morning around 6 AM that woke her up from her sleep.  Had a similar episode in the past, does have an echo on file from 2011 which revealed:  EF 60%-65%  CBC without any leukocytosis, hemoglobin is within normal limits no signs of anemia.  BMP without any electrolyte derangement, creatinine level is within  normal limits.  BNP was obtained as patient does have a prior history of CHF, this is negative today, low suspicion for any exacerbation of CHF.  Troponin was less than 2.  Will obtain delta for further evaluation.  Beta hCG is negative.  An x-ray of her chest showed no consolidation, pneumothorax, pleural effusion.  Patient does have a procedure scheduled tomorrow with nuclear medicine, she has been having some epigastric pain for the past couple of weeks, she does have a nuclear medicine hepato-with ejection fraction CT order by Dr. Collene Mares, [facility] schedule tomorrow.  3:54 PM Second troponin without changes. I have discussed patient with Dr. Eulis Foster he has evaluate patient and agrees with management.  Patient does have a new patient appointment schedule with Dr. Sallyanne Kuster for December, will place call for cardiology in order to have this appointment moved up.  4:43 PM Spoke to [facility] NP, who advice on forwarding patient's chart to [facility] in order to have this appointment rescheduled.  I have discussed with daughter at the bedside that she will need to call tomorrow in order to obtain a new date for this appointment.  Patient and mother understand and agree with management.  Return precautions provided at length.   Portions of this note were generated with [employer]. Dictation errors may occur despite best attempts at proofreading.  Final Clinical Impressions(s) / ED Diagnoses   Final diagnoses:  Abnormal EKG  Atypical chest pain    ED Discharge Orders    None       Janeece Fitting, PA-C 03/04/19 [place], [place], DO 03/05/19 [phone]

## 2019-03-06 ENCOUNTER — Other Ambulatory Visit: Payer: Self-pay

## 2019-03-06 ENCOUNTER — Telehealth: Payer: Self-pay | Admitting: Internal Medicine

## 2019-03-06 ENCOUNTER — Ambulatory Visit (HOSPITAL_COMMUNITY)
Admission: RE | Admit: 2019-03-06 | Discharge: 2019-03-06 | Disposition: A | Discharge status: Home / Self Care | Payer: Commercial Managed Care - PPO | Source: Ambulatory Visit | Attending: Gastroenterology | Admitting: Gastroenterology

## 2019-03-06 DIAGNOSIS — R1011 Right upper quadrant pain: Secondary | ICD-10-CM | POA: Diagnosis not present

## 2019-03-06 MED ORDER — TECHNETIUM TC 99M MEBROFENIN IV KIT
5.0000 | PACK | Freq: Once | INTRAVENOUS | Status: AC | PRN
  Administered 2019-03-06: 5 via INTRAVENOUS

## 2019-03-06 NOTE — Telephone Encounter (Signed)
Patient has 3 weeks RUQ pain  HIDA w/ EF neg today  Pain usu 2-3 mins but had another episode today - not positional  [place] and HIDA neg though had + Murphy's sign  Suggested Tylenol or ibuprofen and contact Dr. Collene Mares 10/5

## 2019-03-20 ENCOUNTER — Other Ambulatory Visit: Payer: Self-pay

## 2019-03-20 ENCOUNTER — Ambulatory Visit (INDEPENDENT_AMBULATORY_CARE_PROVIDER_SITE_OTHER): Payer: Commercial Managed Care - PPO | Admitting: Cardiovascular Disease

## 2019-03-20 ENCOUNTER — Encounter: Payer: Self-pay | Admitting: Cardiovascular Disease

## 2019-03-20 VITALS — BP 122/70 | HR 61 | Temp 97.1°F | Ht 62.0 in | Wt 221.0 lb

## 2019-03-20 DIAGNOSIS — R079 Chest pain, unspecified: Secondary | ICD-10-CM

## 2019-03-20 DIAGNOSIS — I10 Essential (primary) hypertension: Secondary | ICD-10-CM | POA: Insufficient documentation

## 2019-03-20 DIAGNOSIS — Z13220 Encounter for screening for lipoid disorders: Secondary | ICD-10-CM

## 2019-03-20 DIAGNOSIS — R0789 Other chest pain: Secondary | ICD-10-CM | POA: Diagnosis not present

## 2019-03-20 LAB — HEPATIC FUNCTION PANEL
ALT: 37 IU/L — ABNORMAL HIGH (ref 0–32)
AST: 29 IU/L (ref 0–40)
Albumin: 4.2 g/dL (ref 3.8–4.9)
Alkaline Phosphatase: 85 IU/L (ref 39–117)
Bilirubin Total: 0.4 mg/dL (ref 0.0–1.2)
Bilirubin, Direct: 0.08 mg/dL (ref 0.00–0.40)
Total Protein: 6.6 g/dL (ref 6.0–8.5)

## 2019-03-20 LAB — LIPID PANEL
Chol/HDL Ratio: 5.4 ratio — ABNORMAL HIGH (ref 0.0–4.4)
Cholesterol, Total: 185 mg/dL (ref 100–199)
HDL: 34 mg/dL — ABNORMAL LOW (ref >39)
LDL Chol Calc (NIH): 125 mg/dL — ABNORMAL HIGH (ref 0–99)
Triglycerides: 143 mg/dL (ref 0–149)
VLDL Cholesterol Cal: 26 mg/dL (ref 5–40)

## 2019-03-20 NOTE — Assessment & Plan Note (Signed)
History of essential potential blood pressure measured today 122/70.  She is on lisinopril.

## 2019-03-20 NOTE — Progress Notes (Signed)
03/20/2019 Marcia Mccoy   10/23/1960  [id number]  Primary Physician Patient, No Pcp Per Primary Cardiologist: Lorretta Harp MD Lupe Carney, [place]  HPI:  Marcia Mccoy is a 58 y.o. moderately overweight divorced Latino female mother of 7 children, grandmother of 55 grandchildren is accompanied by one of her daughters Mayra and an interpreter [place].  She does not have a primary care physician at this time.  Her risk factors include mild hypertension and family history with a mother who died of a myocardial infarction.  She is never had a heart attack or stroke.  She ordinarily denies chest pain or shortness of breath.  She was seen several weeks ago in the ER for abdominal pain and evaluated by Dr. Eulis Foster as well.  She apparently had a negative hepatobiliary nuclear medicine scan.  She is scheduled to see Dr. Collene Mares in the near future for colonoscopy and Dr. Ninfa Linden for evaluation of gallbladder disease.  She saw Dr. Alfonse Spruce for second time in the ER on 03/04/2019 with chest pain rating to her left upper extremity.  Her EKG showed nonspecific findings and her enzymes were negative.  She has had twinges of atypical chest pain since.   Current Meds  Medication Sig  . fluticasone (FLONASE) 50 MCG/ACT nasal spray Place 1 spray into both nostrils daily.  Marland Kitchen lisinopril (ZESTRIL) 5 MG tablet Take 5 mg by mouth daily.  . montelukast (SINGULAIR) 10 MG tablet Take 10 mg by mouth daily.  . [DISCONTINUED] benzonatate (TESSALON) 100 MG capsule Take 1-2 capsules (100-200 mg total) by mouth 3 (three) times daily as needed for cough.  . [DISCONTINUED] cetirizine (ZYRTEC) 10 MG tablet Take 1 tablet (10 mg total) by mouth daily.  . [DISCONTINUED] HYDROcodone-homatropine (HYCODAN) 5-1.5 MG/5ML syrup Take 5 mLs by mouth at bedtime as needed.     No Known Allergies  Social History   Socioeconomic History  . Marital status: Divorced    Spouse name: Not on file  . Number of children: Not on file  .  Years of education: Not on file  . Highest education level: Not on file  Occupational History  . Not on file  Social Needs  . Financial resource strain: Not on file  . Food insecurity    Worry: Not on file    Inability: Not on file  . Transportation needs    Medical: Not on file    Non-medical: Not on file  Tobacco Use  . Smoking status: Never Smoker  . Smokeless tobacco: Never Used  Substance and Sexual Activity  . Alcohol use: No  . Drug use: No  . Sexual activity: Not on file  Lifestyle  . Physical activity    Days per week: Not on file    Minutes per session: Not on file  . Stress: Not on file  Relationships  . Social Herbalist on phone: Not on file    Gets together: Not on file    Attends religious service: Not on file    Active member of club or organization: Not on file    Attends meetings of clubs or organizations: Not on file    Relationship status: Not on file  . Intimate partner violence    Fear of current or ex partner: Not on file    Emotionally abused: Not on file    Physically abused: Not on file    Forced sexual activity: Not on file  Other Topics Concern  .  Not on file  Social History Narrative  . Not on file     Review of Systems: General: negative for chills, fever, night sweats or weight changes.  Cardiovascular: negative for chest pain, dyspnea on exertion, edema, orthopnea, palpitations, paroxysmal nocturnal dyspnea or shortness of breath Dermatological: negative for rash Respiratory: negative for cough or wheezing Urologic: negative for hematuria Abdominal: negative for nausea, vomiting, diarrhea, bright red blood per rectum, melena, or hematemesis Neurologic: negative for visual changes, syncope, or dizziness All other systems reviewed and are otherwise negative except as noted above.    Blood pressure 122/70, pulse 61, temperature (!) 97.1 F (36.2 C), height 5\' 2"  (1.575 m), weight 221 lb (100.2 kg).  General appearance:  alert and no distress Neck: no adenopathy, no carotid bruit, no JVD, supple, symmetrical, trachea midline and thyroid not enlarged, symmetric, no tenderness/mass/nodules Lungs: clear to auscultation bilaterally Heart: regular rate and rhythm, S1, S2 normal, no murmur, click, rub or gallop Extremities: extremities normal, atraumatic, no cyanosis or edema Pulses: 2+ and symmetric Skin: Skin color, texture, turgor normal. No rashes or lesions Neurologic: Alert and oriented X 3, normal strength and tone. Normal symmetric reflexes. Normal coordination and gait  EKG not performed today  ASSESSMENT AND PLAN:   Essential hypertension History of essential potential blood pressure measured today 122/70.  She is on lisinopril.  CHEST PAIN, ATYPICAL Ms. Arrellano was recently seen in the ER by Dr. Eulis Foster on 03/04/2019 for atypical chest pain.  Her work-up was unrevealing.  Enzymes were negative and her EKG showed nonspecific changes.  Her only risk factor is treated hyper tension and family history with a mother who died of a myocardial infarction.  She is scheduled for GI work-up in the near future by Drs. Ninfa Linden and Dr. Collene Mares.  Ongoing to get a coronary calcium score to her stratify her.      Lorretta Harp MD FACP,FACC,FAHA, [facility] 03/20/2019 11:03 AM

## 2019-03-20 NOTE — Patient Instructions (Signed)
Medication Instructions:  Your physician recommends that you continue on your current medications as directed. Please refer to the Current Medication list given to you today.  If you need a refill on your cardiac medications before your next appointment, please call your pharmacy.   Lab work: Your physician recommends that you have lab work today: [place]  If you have labs (blood work) drawn today and your tests are completely normal, you will receive your results only by: Marland Kitchen MyChart Message (if you have MyChart) OR . A paper copy in the mail If you have any lab test that is abnormal or we need to change your treatment, we will call you to review the results.  Testing/Procedures: Your physician has recommended that you have a coronary calcium scan. This will cost $150 out-of-pocket.  Coronary Calcium Scan A coronary calcium scan is an imaging test used to look for deposits of calcium and other fatty materials (plaques) in the inner lining of the blood vessels of the heart (coronary arteries). These deposits of calcium and plaques can partly clog and narrow the coronary arteries without producing any symptoms or warning signs. This puts a person at risk for a heart attack. This test can detect these deposits before symptoms develop. Tell a health care provider about:  Any allergies you have.  All medicines you are taking, including vitamins, herbs, eye drops, creams, and over-the-counter medicines.  Any problems you or family members have had with anesthetic medicines.  Any blood disorders you have.  Any surgeries you have had.  Any medical conditions you have.  Whether you are pregnant or may be pregnant. What are the risks? Generally, this is a safe procedure. However, problems may occur, including:  Harm to a pregnant woman and her unborn baby. This test involves the use of radiation. Radiation exposure can be dangerous to a pregnant woman and her unborn baby. If  you are pregnant, you generally should not have this procedure done.  Slight increase in the risk of cancer. This is because of the radiation involved in the test. What happens before the procedure? No preparation is needed for this procedure. What happens during the procedure?   You will undress and remove any jewelry around your neck or chest.  You will put on a hospital gown.  Sticky electrodes will be placed on your chest. The electrodes will be connected to an electrocardiogram (ECG) machine to record a tracing of the electrical activity of your heart.  A CT scanner will take pictures of your heart. During this time, you will be asked to lie still and hold your breath for 2-3 seconds while a picture of your heart is being taken. The procedure may vary among health care providers and hospitals. What happens after the procedure?  You can get dressed.  You can return to your normal activities.  It is up to you to get the results of your test. Ask your health care provider, or the department that is doing the test, when your results will be ready. Summary  A coronary calcium scan is an imaging test used to look for deposits of calcium and other fatty materials (plaques) in the inner lining of the blood vessels of the heart (coronary arteries).  Generally, this is a safe procedure. Tell your health care provider if you are pregnant or may be pregnant.  No preparation is needed for this procedure.  A CT scanner will take pictures of your heart.  You can return  to your normal activities after the scan is done. This information is not intended to replace advice given to you by your health care provider. Make sure you discuss any questions you have with your health care provider.   Follow-Up: At [facility], you and your health needs are our priority.  As part of our continuing mission to provide you with exceptional heart care, we have created designated Provider Care Teams.   These Care Teams include your primary Cardiologist (physician) and Advanced Practice Providers (APPs -  Physician Assistants and Nurse Practitioners) who all work together to provide you with the care you need, when you need it. . You may schedule a follow up appointment as needed. You may see Dr. Gwenlyn Found or one of the following Advanced Practice Providers on your designated Care Team:   . Kerin Ransom, PA-C . Daleen Snook Kroeger, PA-C . Sande Rives, PA-C

## 2019-03-20 NOTE — Assessment & Plan Note (Signed)
Marcia Mccoy was recently seen in the ER by Dr. Eulis Foster on 03/04/2019 for atypical chest pain.  Her work-up was unrevealing.  Enzymes were negative and her EKG showed nonspecific changes.  Her only risk factor is treated hyper tension and family history with a mother who died of a myocardial infarction.  She is scheduled for GI work-up in the near future by Drs. Ninfa Linden and Dr. Collene Mares.  Ongoing to get a coronary calcium score to her stratify her.

## 2019-03-27 ENCOUNTER — Other Ambulatory Visit: Payer: Self-pay | Admitting: Surgery

## 2019-03-27 ENCOUNTER — Other Ambulatory Visit: Payer: Self-pay

## 2019-03-27 ENCOUNTER — Telehealth: Payer: Self-pay

## 2019-03-27 DIAGNOSIS — E785 Hyperlipidemia, unspecified: Secondary | ICD-10-CM

## 2019-03-27 NOTE — Telephone Encounter (Signed)
Lab slip for repeat lipid and liver panels mailed 10/23

## 2019-04-09 NOTE — Progress Notes (Signed)
Bethena Roys called to confirm interpreter for DOS. The interpreter to come is Zenda Alpers.

## 2019-04-10 ENCOUNTER — Other Ambulatory Visit: Payer: Self-pay

## 2019-04-10 ENCOUNTER — Other Ambulatory Visit (HOSPITAL_COMMUNITY)
Admission: RE | Admit: 2019-04-10 | Discharge: 2019-04-10 | Disposition: A | Discharge status: Home / Self Care | Payer: Commercial Managed Care - PPO | Source: Ambulatory Visit | Attending: Surgery | Admitting: Surgery

## 2019-04-10 ENCOUNTER — Encounter (HOSPITAL_BASED_OUTPATIENT_CLINIC_OR_DEPARTMENT_OTHER): Payer: Self-pay | Admitting: *Deleted

## 2019-04-10 DIAGNOSIS — Z01812 Encounter for preprocedural laboratory examination: Secondary | ICD-10-CM | POA: Diagnosis present

## 2019-04-10 DIAGNOSIS — Z20828 Contact with and (suspected) exposure to other viral communicable diseases: Secondary | ICD-10-CM | POA: Diagnosis not present

## 2019-04-11 LAB — NOVEL CORONAVIRUS, NAA (HOSP ORDER, SEND-OUT TO REF LAB; TAT 18-24 HRS): SARS-CoV-2, NAA: NOT DETECTED

## 2019-04-13 NOTE — H&P (Signed)
Marcia Mccoy Documented: 03/27/2019 9:49 AM Location: [facility] Patient #: [id number] DOB: 12/29/1960 Divorced / Language: Cleophus Molt / Race: White Female   History of Present Illness (Akeema Broder A. Ninfa Linden MD; 03/27/2019 10:03 AM) The patient is a 58 year old female who presents with abdominal pain. This is a patient whom I saw 3 years ago with biliary colic and I suspect chronic cholecystitis. Originally, she was going to undergo a laparoscopic cholecystectomy but then decided to hold surgery. She is now having worsening right upper quadrant abdominal pain with pain to the back and nausea with occasional vomiting after fatty meals. She has had no change in her other medical care. All of her studies are unchanged other than a positive Murphy sign on ultrasound. She has some constipation.   Past Surgical History Andreas Blower, [place]; 03/27/2019 9:50 AM) Cesarean Section - 1  Colon Polyp Removal - Colonoscopy   Diagnostic Studies History (Armen Glo Herring, CMA; 03/27/2019 9:50 AM) Mammogram  within last year >3 years ago Pap Smear  1-5 years ago  Medication History (Andreas Blower, CMA; 03/27/2019 9:51 AM) Montelukast Sodium (10MG  Tablet, Oral) Active. Lisinopril (5MG  Tablet, Oral) Active. Fluticasone Propionate (50MCG/ACT Suspension, Nasal) Active. Medications Reconciled  Social History Andreas Blower, [place]; 03/27/2019 9:50 AM) Alcohol use  Occasional alcohol use. Caffeine use  Coffee. No drug use  Tobacco use  Former smoker, Never smoker.  Family History Andreas Blower, [place]; 03/27/2019 9:50 AM) Cancer  Daughter, Father. Diabetes Mellitus  Brother, Daughter, Mother. Heart Disease  Brother, Family Members In General, Mother. Hypertension  Brother, Mother. Thyroid problems  Daughter.  Pregnancy / Birth History Andreas Blower, [place]; 03/27/2019 9:50 AM) Age at menarche  23 years. Age of menopause  22-50 <45 Gravida  8 Length (months) of  breastfeeding  7-12 Maternal age  16-20 Para  92  Other Problems Andreas Blower, [place]; 03/27/2019 9:50 AM) Bladder Problems  High blood pressure  Hypercholesterolemia  Umbilical Hernia Repair     Review of Systems (Armen Ferguson CMA; 03/27/2019 9:50 AM) Skin Not Present- Change in Wart/Mole, Dryness, Hives, Jaundice, New Lesions, Non-Healing Wounds, Rash and Ulcer. HEENT Present- Ringing in the Ears, Seasonal Allergies and Wears glasses/contact lenses. Not Present- Earache, Hearing Loss, Hoarseness, Nose Bleed, Oral Ulcers, Sinus Pain, Sore Throat, Visual Disturbances and Yellow Eyes. Cardiovascular Present- Swelling of Extremities. Not Present- Chest Pain, Difficulty Breathing Lying Down, Leg Cramps, Palpitations, Rapid Heart Rate and Shortness of Breath. Gastrointestinal Present- Abdominal Pain, Bloating and Nausea. Not Present- Bloody Stool, Change in Bowel Habits, Chronic diarrhea, Constipation, Difficulty Swallowing, Excessive gas, Gets full quickly at meals, Hemorrhoids, Indigestion, Rectal Pain and Vomiting. Female Genitourinary Present- Frequency. Not Present- Nocturia, Painful Urination, Pelvic Pain and Urgency. Neurological Present- Headaches and Weakness. Not Present- Decreased Memory, Fainting, Numbness, Seizures, Tingling, Tremor and Trouble walking. Psychiatric Present- Change in Sleep Pattern. Not Present- Anxiety, Bipolar, Depression, Fearful and Frequent crying. Endocrine Present- Hair Changes and Hot flashes. Not Present- Cold Intolerance, Excessive Hunger, Heat Intolerance and New Diabetes. Hematology Not Present- Blood Thinners, Easy Bruising, Excessive bleeding, Gland problems, HIV and Persistent Infections.  Vitals (Armen Ferguson CMA; 03/27/2019 9:50 AM) 03/27/2019 9:50 AM Weight: 222.38 lb Height: 61in Body Surface Area: 1.98 m Body Mass Index: 42.02 kg/m  Temp.: 98.109F  Pulse: 65 (Regular)  P.OX: 97% (Room air) BP: 130/90 (Sitting, Left Arm,  Standard)       Physical Exam (Laronn Devonshire A. Ninfa Linden MD; 03/27/2019 10:03 AM) The physical exam findings are as follows: Note:On exam, her abdomen is  soft and obese. There is tenderness with guarding in the right upper quadrant. Generally in NAD Lungs clear CV RRR Ext without edema Skin without rash  I reviewed her x-ray data.    Assessment & Plan (Wynnie Pacetti A. Ninfa Linden MD; 03/27/2019 [id number] AM) BILIARY COLIC ([id number]) Impression: This is a patient with biliary colic and I suspect chronic cholecystitis. Given her worsening symptoms, she wishes now to proceed with a laparoscopic cholecystectomy. We again discussed the surgical procedure in detail. We discussed the risk which includes but is not limited to bleeding, infection, injury to surrounding structures, need to convert to an open procedure, bile duct injury or leak, the chance this may not resolve all of her symptoms, postoperative recovery, etc. She understands and wishes to proceed with surgery.

## 2019-04-14 ENCOUNTER — Ambulatory Visit (HOSPITAL_BASED_OUTPATIENT_CLINIC_OR_DEPARTMENT_OTHER): Payer: Commercial Managed Care - PPO | Admitting: Anesthesiology

## 2019-04-14 ENCOUNTER — Other Ambulatory Visit: Payer: Self-pay

## 2019-04-14 ENCOUNTER — Encounter (HOSPITAL_BASED_OUTPATIENT_CLINIC_OR_DEPARTMENT_OTHER)
Admission: RE | Disposition: A | Discharge status: Home / Self Care | Payer: Self-pay | Source: Home / Self Care | Attending: Surgery

## 2019-04-14 ENCOUNTER — Encounter (HOSPITAL_BASED_OUTPATIENT_CLINIC_OR_DEPARTMENT_OTHER): Payer: Self-pay | Admitting: *Deleted

## 2019-04-14 ENCOUNTER — Ambulatory Visit (HOSPITAL_BASED_OUTPATIENT_CLINIC_OR_DEPARTMENT_OTHER)
Admission: RE | Admit: 2019-04-14 | Discharge: 2019-04-14 | Disposition: A | Discharge status: Home / Self Care | Payer: Commercial Managed Care - PPO | Attending: Surgery | Admitting: Surgery

## 2019-04-14 DIAGNOSIS — Z87891 Personal history of nicotine dependence: Secondary | ICD-10-CM | POA: Insufficient documentation

## 2019-04-14 DIAGNOSIS — Z86010 Personal history of colonic polyps: Secondary | ICD-10-CM | POA: Diagnosis not present

## 2019-04-14 DIAGNOSIS — Z809 Family history of malignant neoplasm, unspecified: Secondary | ICD-10-CM | POA: Diagnosis not present

## 2019-04-14 DIAGNOSIS — Z8249 Family history of ischemic heart disease and other diseases of the circulatory system: Secondary | ICD-10-CM | POA: Diagnosis not present

## 2019-04-14 DIAGNOSIS — F419 Anxiety disorder, unspecified: Secondary | ICD-10-CM | POA: Diagnosis not present

## 2019-04-14 DIAGNOSIS — Z8349 Family history of other endocrine, nutritional and metabolic diseases: Secondary | ICD-10-CM | POA: Insufficient documentation

## 2019-04-14 DIAGNOSIS — Z6841 Body Mass Index (BMI) 40.0 and over, adult: Secondary | ICD-10-CM | POA: Diagnosis not present

## 2019-04-14 DIAGNOSIS — Z79899 Other long term (current) drug therapy: Secondary | ICD-10-CM | POA: Insufficient documentation

## 2019-04-14 DIAGNOSIS — K828 Other specified diseases of gallbladder: Secondary | ICD-10-CM | POA: Diagnosis not present

## 2019-04-14 DIAGNOSIS — K811 Chronic cholecystitis: Secondary | ICD-10-CM | POA: Insufficient documentation

## 2019-04-14 DIAGNOSIS — Z833 Family history of diabetes mellitus: Secondary | ICD-10-CM | POA: Diagnosis not present

## 2019-04-14 DIAGNOSIS — E7800 Pure hypercholesterolemia, unspecified: Secondary | ICD-10-CM | POA: Insufficient documentation

## 2019-04-14 DIAGNOSIS — K8020 Calculus of gallbladder without cholecystitis without obstruction: Secondary | ICD-10-CM | POA: Diagnosis present

## 2019-04-14 DIAGNOSIS — F329 Major depressive disorder, single episode, unspecified: Secondary | ICD-10-CM | POA: Insufficient documentation

## 2019-04-14 DIAGNOSIS — I10 Essential (primary) hypertension: Secondary | ICD-10-CM | POA: Insufficient documentation

## 2019-04-14 HISTORY — PX: CHOLECYSTECTOMY: SHX55

## 2019-04-14 HISTORY — DX: Calculus of bile duct without cholangitis or cholecystitis without obstruction: K80.50

## 2019-04-14 SURGERY — LAPAROSCOPIC CHOLECYSTECTOMY
Anesthesia: General | Site: Abdomen

## 2019-04-14 MED ORDER — FENTANYL CITRATE (PF) 100 MCG/2ML IJ SOLN
25.0000 ug | INTRAMUSCULAR | Status: DC | PRN
  Administered 2019-04-14 (×2): 25 ug via INTRAVENOUS

## 2019-04-14 MED ORDER — OXYCODONE HCL 5 MG PO TABS
5.0000 mg | ORAL_TABLET | Freq: Once | ORAL | Status: AC | PRN
  Administered 2019-04-14: 14:00:00 5 mg via ORAL

## 2019-04-14 MED ORDER — CELECOXIB 200 MG PO CAPS
200.0000 mg | ORAL_CAPSULE | ORAL | Status: AC
  Administered 2019-04-14: 200 mg via ORAL

## 2019-04-14 MED ORDER — BUPIVACAINE-EPINEPHRINE (PF) 0.5% -1:200000 IJ SOLN
INTRAMUSCULAR | Status: DC | PRN
  Administered 2019-04-14: 20 mL via PERINEURAL

## 2019-04-14 MED ORDER — ACETAMINOPHEN 500 MG PO TABS
1000.0000 mg | ORAL_TABLET | ORAL | Status: AC
  Administered 2019-04-14: 1000 mg via ORAL

## 2019-04-14 MED ORDER — FENTANYL CITRATE (PF) 100 MCG/2ML IJ SOLN
INTRAMUSCULAR | Status: AC
  Filled 2019-04-14: qty 2

## 2019-04-14 MED ORDER — CEFAZOLIN SODIUM-DEXTROSE 2-4 GM/100ML-% IV SOLN
INTRAVENOUS | Status: AC
  Filled 2019-04-14: qty 100

## 2019-04-14 MED ORDER — LIDOCAINE HCL (CARDIAC) PF 100 MG/5ML IV SOSY
PREFILLED_SYRINGE | INTRAVENOUS | Status: DC | PRN
  Administered 2019-04-14: 60 mg via INTRAVENOUS

## 2019-04-14 MED ORDER — ROCURONIUM BROMIDE 100 MG/10ML IV SOLN
INTRAVENOUS | Status: DC | PRN
  Administered 2019-04-14: 80 mg via INTRAVENOUS

## 2019-04-14 MED ORDER — CHLORHEXIDINE GLUCONATE CLOTH 2 % EX PADS: 6.0000 | MEDICATED_PAD | Freq: Once | CUTANEOUS | Status: DC

## 2019-04-14 MED ORDER — MIDAZOLAM HCL 2 MG/2ML IJ SOLN
1.0000 mg | INTRAMUSCULAR | Status: DC | PRN
  Administered 2019-04-14: 2 mg via INTRAVENOUS

## 2019-04-14 MED ORDER — LACTATED RINGERS IV SOLN
INTRAVENOUS | Status: DC
  Administered 2019-04-14 (×3): via INTRAVENOUS

## 2019-04-14 MED ORDER — DEXAMETHASONE SODIUM PHOSPHATE 4 MG/ML IJ SOLN
INTRAMUSCULAR | Status: DC | PRN
  Administered 2019-04-14: 10 mg via INTRAVENOUS

## 2019-04-14 MED ORDER — ROCURONIUM BROMIDE 10 MG/ML (PF) SYRINGE
PREFILLED_SYRINGE | INTRAVENOUS | Status: AC
  Filled 2019-04-14: qty 10

## 2019-04-14 MED ORDER — SUCCINYLCHOLINE CHLORIDE 200 MG/10ML IV SOSY
PREFILLED_SYRINGE | INTRAVENOUS | Status: AC
  Filled 2019-04-14: qty 10

## 2019-04-14 MED ORDER — FENTANYL CITRATE (PF) 100 MCG/2ML IJ SOLN
50.0000 ug | INTRAMUSCULAR | Status: DC | PRN
  Administered 2019-04-14: 100 ug via INTRAVENOUS

## 2019-04-14 MED ORDER — TRAMADOL HCL 50 MG PO TABS: 50.0000 mg | ORAL_TABLET | Freq: Four times a day (QID) | ORAL | 0 refills | Status: AC | PRN

## 2019-04-14 MED ORDER — ONDANSETRON HCL 4 MG/2ML IJ SOLN
INTRAMUSCULAR | Status: AC
  Filled 2019-04-14: qty 2

## 2019-04-14 MED ORDER — DEXAMETHASONE SODIUM PHOSPHATE 10 MG/ML IJ SOLN
INTRAMUSCULAR | Status: AC
  Filled 2019-04-14: qty 1

## 2019-04-14 MED ORDER — SUGAMMADEX SODIUM 500 MG/5ML IV SOLN
INTRAVENOUS | Status: DC | PRN
  Administered 2019-04-14: 400 mg via INTRAVENOUS

## 2019-04-14 MED ORDER — PROPOFOL 10 MG/ML IV BOLUS
INTRAVENOUS | Status: DC | PRN
  Administered 2019-04-14: 150 mg via INTRAVENOUS

## 2019-04-14 MED ORDER — EPHEDRINE 5 MG/ML INJ
INTRAVENOUS | Status: AC
  Filled 2019-04-14: qty 10

## 2019-04-14 MED ORDER — ONDANSETRON HCL 4 MG/2ML IJ SOLN
INTRAMUSCULAR | Status: DC | PRN
  Administered 2019-04-14: 4 mg via INTRAVENOUS

## 2019-04-14 MED ORDER — OXYCODONE HCL 5 MG PO TABS
ORAL_TABLET | ORAL | Status: AC
  Filled 2019-04-14: qty 1

## 2019-04-14 MED ORDER — LIDOCAINE 2% (20 MG/ML) 5 ML SYRINGE
INTRAMUSCULAR | Status: AC
  Filled 2019-04-14: qty 5

## 2019-04-14 MED ORDER — GABAPENTIN 300 MG PO CAPS
300.0000 mg | ORAL_CAPSULE | ORAL | Status: AC
  Administered 2019-04-14: 300 mg via ORAL

## 2019-04-14 MED ORDER — SODIUM CHLORIDE 0.9 % IR SOLN
Status: DC | PRN
  Administered 2019-04-14: 1000 mL

## 2019-04-14 MED ORDER — CEFAZOLIN SODIUM-DEXTROSE 2-4 GM/100ML-% IV SOLN
2.0000 g | INTRAVENOUS | Status: AC
  Administered 2019-04-14: 2 g via INTRAVENOUS

## 2019-04-14 MED ORDER — MIDAZOLAM HCL 2 MG/2ML IJ SOLN
INTRAMUSCULAR | Status: AC
  Filled 2019-04-14: qty 2

## 2019-04-14 MED ORDER — ACETAMINOPHEN 500 MG PO TABS
ORAL_TABLET | ORAL | Status: AC
  Filled 2019-04-14: qty 2

## 2019-04-14 MED ORDER — SUGAMMADEX SODIUM 500 MG/5ML IV SOLN
INTRAVENOUS | Status: AC
  Filled 2019-04-14: qty 5

## 2019-04-14 MED ORDER — PROPOFOL 10 MG/ML IV BOLUS
INTRAVENOUS | Status: AC
  Filled 2019-04-14: qty 20

## 2019-04-14 MED ORDER — CELECOXIB 200 MG PO CAPS
ORAL_CAPSULE | ORAL | Status: AC
  Filled 2019-04-14: qty 1

## 2019-04-14 MED ORDER — GABAPENTIN 300 MG PO CAPS
ORAL_CAPSULE | ORAL | Status: AC
  Filled 2019-04-14: qty 1

## 2019-04-14 SURGICAL SUPPLY — 35 items
APPLIER CLIP 5 13 M/L LIGAMAX5 (MISCELLANEOUS) ×2
BLADE CLIPPER SURG (BLADE) IMPLANT
CHLORAPREP W/TINT 26 (MISCELLANEOUS) ×2 IMPLANT
CLIP APPLIE 5 13 M/L LIGAMAX5 (MISCELLANEOUS) ×1 IMPLANT
COVER MAYO STAND REUSABLE (DRAPES) ×2 IMPLANT
COVER WAND RF STERILE (DRAPES) IMPLANT
DECANTER SPIKE VIAL GLASS SM (MISCELLANEOUS) IMPLANT
DERMABOND ADVANCED (GAUZE/BANDAGES/DRESSINGS) ×1
DERMABOND ADVANCED .7 DNX12 (GAUZE/BANDAGES/DRESSINGS) ×1 IMPLANT
DRAPE C-ARM 42X72 X-RAY (DRAPES) IMPLANT
DRAPE LAPAROSCOPIC ABDOMINAL (DRAPES) ×2 IMPLANT
ELECT REM PT RETURN 9FT ADLT (ELECTROSURGICAL) ×2
ELECTRODE REM PT RTRN 9FT ADLT (ELECTROSURGICAL) ×1 IMPLANT
FILTER SMOKE EVAC LAPAROSHD (FILTER) IMPLANT
GLOVE SURG SIGNA 7.5 PF LTX (GLOVE) ×6 IMPLANT
GOWN STRL REUS W/ TWL LRG LVL3 (GOWN DISPOSABLE) ×2 IMPLANT
GOWN STRL REUS W/ TWL XL LVL3 (GOWN DISPOSABLE) ×1 IMPLANT
GOWN STRL REUS W/TWL LRG LVL3 (GOWN DISPOSABLE) ×2
GOWN STRL REUS W/TWL XL LVL3 (GOWN DISPOSABLE) ×1
NS IRRIG 1000ML POUR BTL (IV SOLUTION) ×2 IMPLANT
PACK BASIN DAY SURGERY FS (CUSTOM PROCEDURE TRAY) ×2 IMPLANT
POUCH SPECIMEN RETRIEVAL 10MM (ENDOMECHANICALS) ×2 IMPLANT
SCISSORS LAP 5X35 DISP (ENDOMECHANICALS) ×2 IMPLANT
SET CHOLANGIOGRAPH 5 50 .035 (SET/KITS/TRAYS/PACK) IMPLANT
SET IRRIG TUBING LAPAROSCOPIC (IRRIGATION / IRRIGATOR) ×2 IMPLANT
SET TUBE SMOKE EVAC HIGH FLOW (TUBING) ×2 IMPLANT
SLEEVE ENDOPATH XCEL 5M (ENDOMECHANICALS) ×4 IMPLANT
SLEEVE SCD COMPRESS KNEE MED (MISCELLANEOUS) ×2 IMPLANT
SPECIMEN JAR SMALL (MISCELLANEOUS) ×2 IMPLANT
SUT MON AB 4-0 PC3 18 (SUTURE) ×2 IMPLANT
TOWEL GREEN STERILE FF (TOWEL DISPOSABLE) ×2 IMPLANT
TRAY LAPAROSCOPIC (CUSTOM PROCEDURE TRAY) ×2 IMPLANT
TROCAR XCEL BLUNT TIP 100MML (ENDOMECHANICALS) ×2 IMPLANT
TROCAR XCEL NON-BLD 5MMX100MML (ENDOMECHANICALS) ×2 IMPLANT
TUBE CONNECTING 20X1/4 (TUBING) ×2 IMPLANT

## 2019-04-14 NOTE — Transfer of Care (Signed)
Immediate Anesthesia Transfer of Care Note  Patient: Marcia Mccoy  Procedure(s) Performed: LAPAROSCOPIC CHOLECYSTECTOMY (N/A Abdomen)  Patient Location: PACU  Anesthesia Type:General  Level of Consciousness: awake, alert , oriented and drowsy  Airway & Oxygen Therapy: Patient Spontanous Breathing and Patient connected to face mask oxygen  Post-op Assessment: Report given to RN and Post -op Vital signs reviewed and stable  Post vital signs: Reviewed and stable  Last Vitals:  Vitals Value Taken Time  BP    Temp    Pulse 83 04/14/19 1101  Resp 26 04/14/19 1101  SpO2 100 % 04/14/19 1101  Vitals shown include unvalidated device data.  Last Pain:  Vitals:   04/14/19 0742  TempSrc: Oral  PainSc: 0-No pain      Patients Stated Pain Goal: 0 ([id number])  Complications: No apparent anesthesia complications

## 2019-04-14 NOTE — Anesthesia Postprocedure Evaluation (Signed)
Anesthesia Post Note  Patient: Marcia Mccoy  Procedure(s) Performed: LAPAROSCOPIC CHOLECYSTECTOMY (N/A Abdomen)     Patient location during evaluation: PACU Anesthesia Type: General Level of consciousness: awake and alert Pain management: pain level controlled Vital Signs Assessment: post-procedure vital signs reviewed and stable Respiratory status: spontaneous breathing, nonlabored ventilation, respiratory function stable and patient connected to nasal cannula oxygen Cardiovascular status: blood pressure returned to baseline and stable Postop Assessment: no apparent nausea or vomiting Anesthetic complications: no    Last Vitals:  Vitals:   04/14/19 1130 04/14/19 1145  BP: (!) 156/80 132/77  Pulse: 64 (!) 52  Resp: (!) 23 (!) 21  Temp:    SpO2: 100% 100%    Last Pain:  Vitals:   04/14/19 1200  TempSrc:   PainSc: 5                  Hurbert Duran L Karsin Pesta

## 2019-04-14 NOTE — Anesthesia Procedure Notes (Signed)
Procedure Name: Intubation Date/Time: 04/14/2019 10:17 AM Performed by: Willa Frater, CRNA Pre-anesthesia Checklist: Patient identified, Emergency Drugs available, Suction available and Patient being monitored Patient Re-evaluated:Patient Re-evaluated prior to induction Oxygen Delivery Method: Circle system utilized Preoxygenation: Pre-oxygenation with 100% oxygen Induction Type: IV induction Ventilation: Mask ventilation without difficulty Tube type: Oral Tube size: 7.0 mm Number of attempts: 1 Airway Equipment and Method: Stylet and Oral airway Placement Confirmation: ETT inserted through vocal cords under direct vision,  positive ETCO2 and breath sounds checked- equal and bilateral Secured at: 20 cm Tube secured with: Tape Dental Injury: Teeth and Oropharynx as per pre-operative assessment

## 2019-04-14 NOTE — Anesthesia Preprocedure Evaluation (Addendum)
Anesthesia Evaluation  Patient identified by MRN, date of birth, ID band Patient awake    Reviewed: Allergy & Precautions, NPO status , Patient's Chart, lab work & pertinent test results  Airway Mallampati: III  TM Distance: >3 FB Neck ROM: Full    Dental no notable dental hx. (+) Teeth Intact, Dental Advisory Given   Pulmonary neg pulmonary ROS,    Pulmonary exam normal breath sounds clear to auscultation       Cardiovascular hypertension, Pt. on medications Normal cardiovascular exam Rhythm:Regular Rate:Normal  TTE 2011 Normal LVEF, mild MR   Neuro/Psych PSYCHIATRIC DISORDERS Anxiety Depression negative neurological ROS     GI/Hepatic negative GI ROS, Neg liver ROS,   Endo/Other  Morbid obesity  Renal/GU negative Renal ROS  negative genitourinary   Musculoskeletal negative musculoskeletal ROS (+)   Abdominal   Peds  Hematology negative hematology ROS (+)   Anesthesia Other Findings   Reproductive/Obstetrics                           Anesthesia Physical Anesthesia Plan  ASA: III  Anesthesia Plan: General   Post-op Pain Management:    Induction: Intravenous  PONV Risk Score and Plan: Midazolam, Dexamethasone and Ondansetron  Airway Management Planned: Oral ETT  Additional Equipment:   Intra-op Plan:   Post-operative Plan: Extubation in OR  Informed Consent: I have reviewed the patients History and Physical, chart, labs and discussed the procedure including the risks, benefits and alternatives for the proposed anesthesia with the patient or authorized representative who has indicated his/her understanding and acceptance.     Dental advisory given  Plan Discussed with: CRNA  Anesthesia Plan Comments:         Anesthesia Quick Evaluation

## 2019-04-14 NOTE — Discharge Instructions (Signed)
[facility] ______[facility] °LAPAROSCOPIC SURGERY: POST OP INSTRUCTIONS °Always review your discharge instruction sheet given to you by the facility where your surgery was performed. °IF YOU HAVE DISABILITY OR FAMILY LEAVE FORMS, YOU MUST BRING THEM TO THE OFFICE FOR PROCESSING.   °DO NOT GIVE THEM TO YOUR DOCTOR. ° °1. A prescription for pain medication may be given to you upon discharge.  Take your pain medication as prescribed, if needed.  If narcotic pain medicine is not needed, then you may take acetaminophen (Tylenol) or ibuprofen (Advil) as needed. °2. Take your usually prescribed medications unless otherwise directed. °3. If you need a refill on your pain medication, please contact your pharmacy.  They will contact our office to request authorization. Prescriptions will not be filled after 5pm or on week-ends. °4. You should follow a light diet the first few days after arrival home, such as soup and crackers, etc.  Be sure to include lots of fluids daily. °5. Most patients will experience some swelling and bruising in the area of the incisions.  Ice packs will help.  Swelling and bruising can take several days to resolve.  °6. It is common to experience some constipation if taking pain medication after surgery.  Increasing fluid intake and taking a stool softener (such as Colace) will usually help or prevent this problem from occurring.  A mild laxative (Milk of Magnesia or Miralax) should be taken according to package instructions if there are no bowel movements after 48 hours. °7. Unless discharge instructions indicate otherwise, you may remove your bandages 24-48 hours after surgery, and you may shower at that time.  You may have steri-strips (small skin tapes) in place directly over the incision.  These strips should be left on the skin for 7-10 days.  If your surgeon used skin glue on the incision, you may shower in 24 hours.  The glue will flake off over the next 2-3 weeks.  Any sutures or  staples will be removed at the office during your follow-up visit. °8. ACTIVITIES:  You may resume regular (light) daily activities beginning the next day--such as daily self-care, walking, climbing stairs--gradually increasing activities as tolerated.  You may have sexual intercourse when it is comfortable.  Refrain from any heavy lifting or straining until approved by your doctor. °a. You may drive when you are no longer taking prescription pain medication, you can comfortably wear a seatbelt, and you can safely maneuver your car and apply brakes. °b. RETURN TO WORK:  __________________________________________________________ °9. You should see your doctor in the office for a follow-up appointment approximately 2-3 weeks after your surgery.  Make sure that you call for this appointment within a day or two after you arrive home to insure a convenient appointment time. °10. OTHER INSTRUCTIONS: __________________________________________________________________________________________________________________________ __________________________________________________________________________________________________________________________ °WHEN TO CALL YOUR DOCTOR: °1. Fever over 101.0 °2. Inability to urinate °3. Continued bleeding from incision. °4. Increased pain, redness, or drainage from the incision. °5. Increasing abdominal pain ° °The clinic staff is available to answer your questions during regular business hours.  Please don’t hesitate to call and ask to speak to one of the nurses for clinical concerns.  If you have a medical emergency, go to the nearest emergency room or call 911.  A surgeon from [facility] is always on call at the hospital. °[address], [address], [place], [place]  [address] ? [address] [address], [place],    [address] °[phone] ? [phone] ? FAX [phone] °Web site:   [email or web address]   No tylenol until after 2pm today.  No ibuprofen until  after 4pm today.    Post Anesthesia Home Care Instructions  Activity: Get plenty of rest for the remainder of the day. A responsible individual must stay with you for 24 hours following the procedure.  For the next 24 hours, DO NOT: -Drive a car -Paediatric nurse -Drink alcoholic beverages -Take any medication unless instructed by your physician -Make any legal decisions or sign important papers.  Meals: Start with liquid foods such as gelatin or soup. Progress to regular foods as tolerated. Avoid greasy, spicy, heavy foods. If nausea and/or vomiting occur, drink only clear liquids until the nausea and/or vomiting subsides. Call your physician if vomiting continues.  Special Instructions/Symptoms: Your throat may feel dry or sore from the anesthesia or the breathing tube placed in your throat during surgery. If this causes discomfort, gargle with warm salt water. The discomfort should disappear within 24 hours.  If you had a scopolamine patch placed behind your ear for the management of post- operative nausea and/or vomiting:  1. The medication in the patch is effective for 72 hours, after which it should be removed.  Wrap patch in a tissue and discard in the trash. Wash hands thoroughly with soap and water. 2. You may remove the patch earlier than 72 hours if you experience unpleasant side effects which may include dry mouth, dizziness or visual disturbances. 3. Avoid touching the patch. Wash your hands with soap and water after contact with the patch.

## 2019-04-14 NOTE — Op Note (Signed)

## 2019-04-14 NOTE — Interval H&P Note (Signed)
History and Physical Interval Note: no change in H and P  04/14/2019 8:45 AM  Marcia Mccoy  has presented today for surgery, with the diagnosis of BILIARY COLIC.  The various methods of treatment have been discussed with the patient and family. After consideration of risks, benefits and other options for treatment, the patient has consented to  Procedure(s): LAPAROSCOPIC CHOLECYSTECTOMY (N/A) as a surgical intervention.  The patient's history has been reviewed, patient examined, no change in status, stable for surgery.  I have reviewed the patient's chart and labs.  Questions were answered to the patient's satisfaction.     Coralie Keens

## 2019-04-15 ENCOUNTER — Encounter (HOSPITAL_BASED_OUTPATIENT_CLINIC_OR_DEPARTMENT_OTHER): Payer: Self-pay | Admitting: Surgery

## 2019-04-15 LAB — SURGICAL PATHOLOGY

## 2019-04-15 NOTE — Addendum Note (Signed)
Addendum  created 04/15/19 1114 by Tawni Millers, CRNA   Charge Capture section accepted

## 2019-04-21 ENCOUNTER — Ambulatory Visit (INDEPENDENT_AMBULATORY_CARE_PROVIDER_SITE_OTHER)
Admission: RE | Admit: 2019-04-21 | Discharge: 2019-04-21 | Disposition: A | Discharge status: Home / Self Care | Payer: Self-pay | Source: Ambulatory Visit | Attending: Cardiovascular Disease | Admitting: Cardiovascular Disease

## 2019-04-21 ENCOUNTER — Other Ambulatory Visit: Payer: Self-pay

## 2019-04-21 DIAGNOSIS — R0789 Other chest pain: Secondary | ICD-10-CM

## 2019-05-05 ENCOUNTER — Ambulatory Visit: Payer: Commercial Managed Care - PPO | Admitting: Cardiovascular Disease

## 2019-08-14 IMAGING — US US ABDOMEN COMPLETE
1 series · 13 of 25 positions shown · non-contrast
Comparison: CT scan 07/16/2010

CLINICAL DATA: Right upper quadrant pain

EXAM:
ABDOMEN ULTRASOUND COMPLETE

[Series 1: us abdomen complete · 0.21mm/px · 13 of 84 slices shown]
[im 1/84]
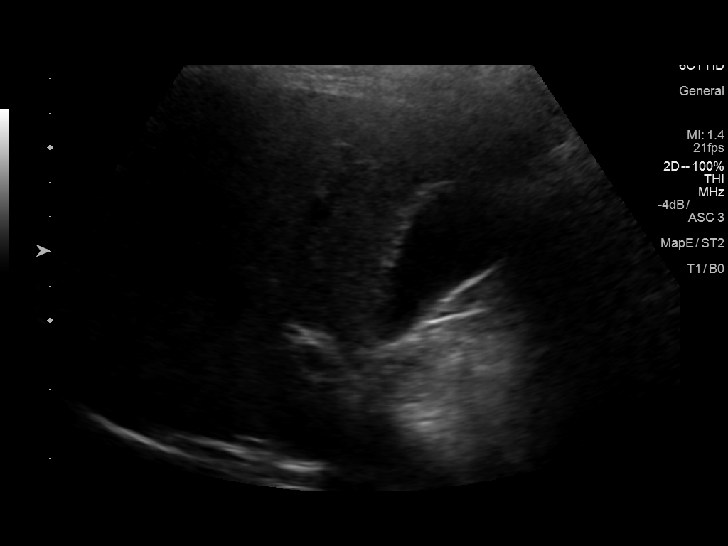
[im 7/84]
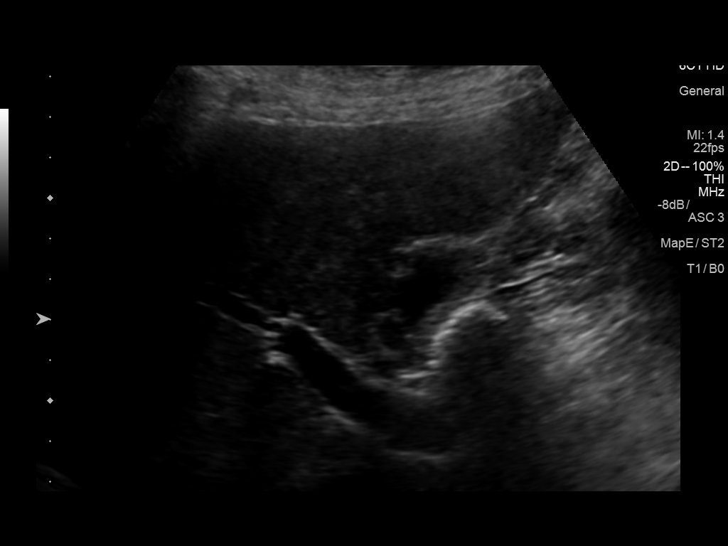
[im 14/84]
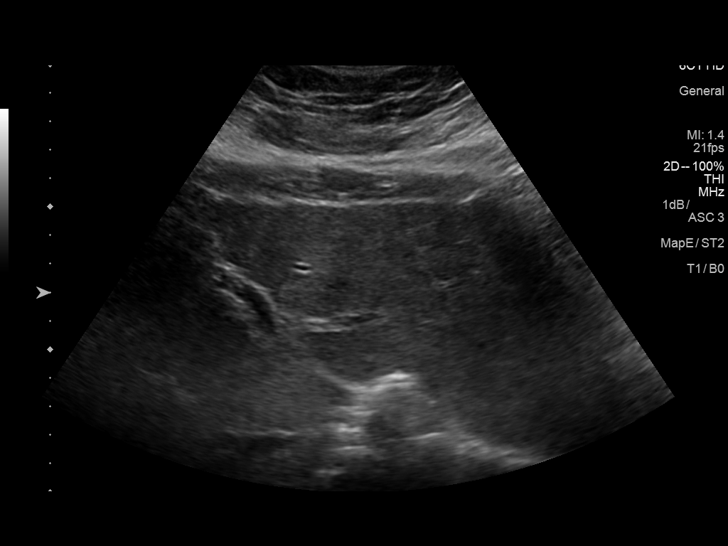
[im 21/84]
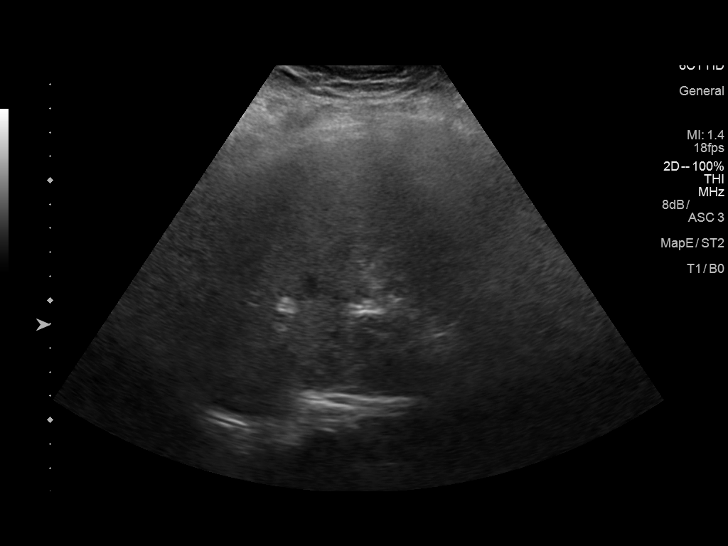
[im 28/84]
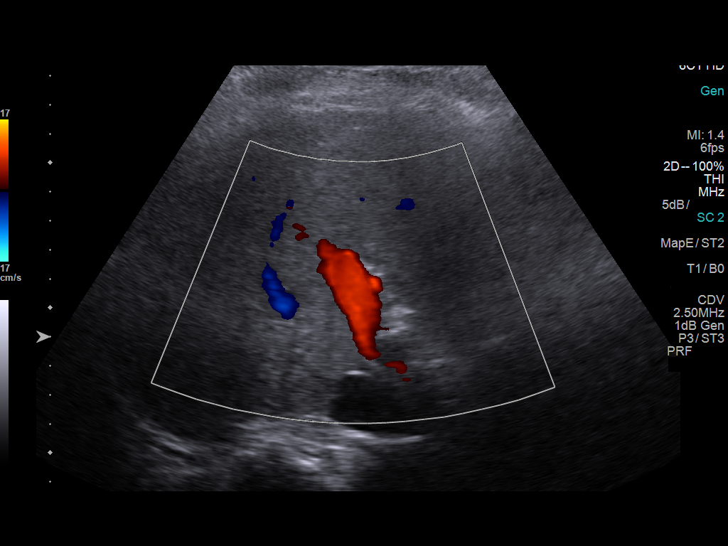
[im 35/84]
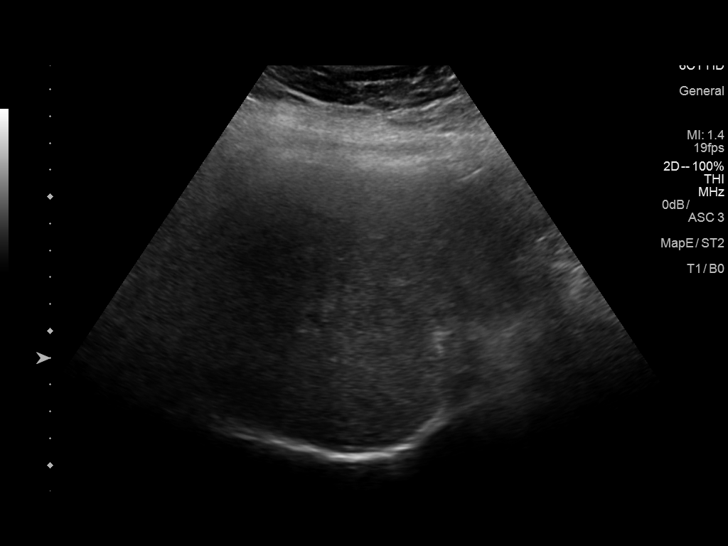
[im 42/84]
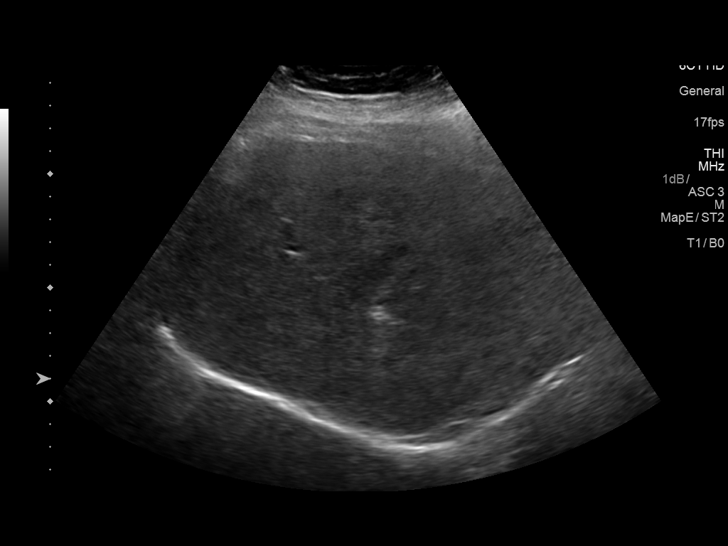
[im 49/84]
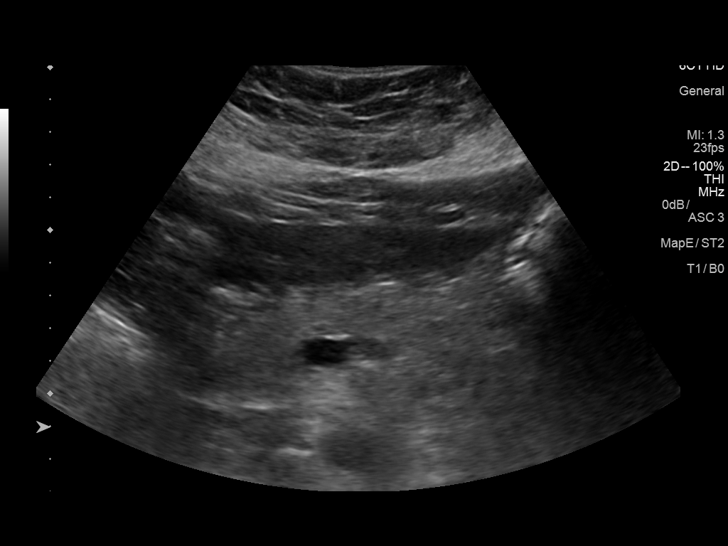
[im 56/84]
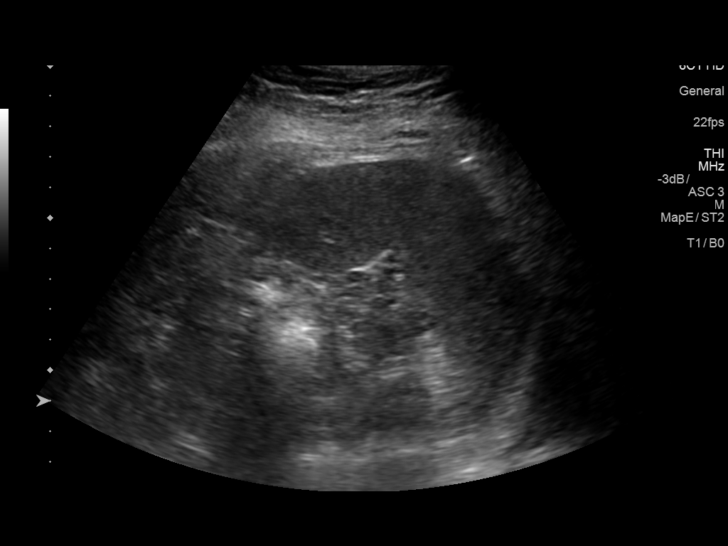
[im 63/84]
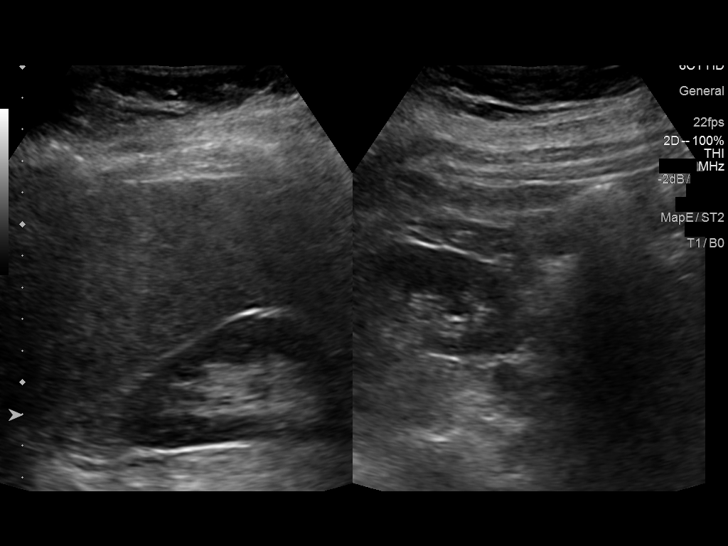
[im 70/84]
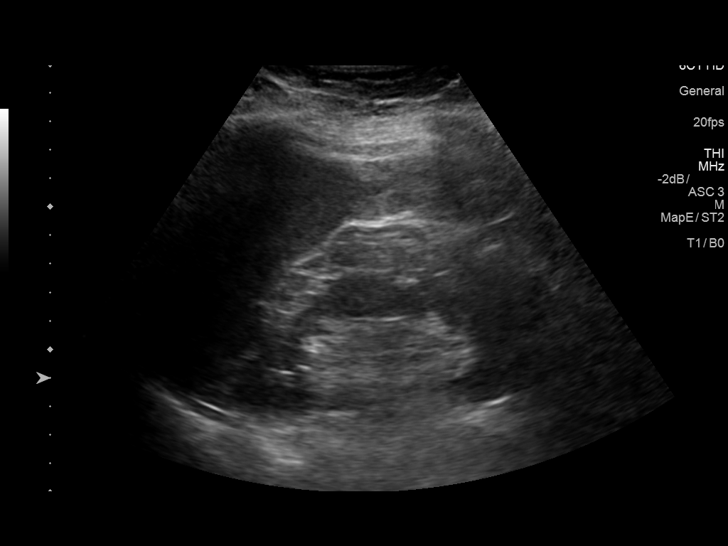
[im 77/84]
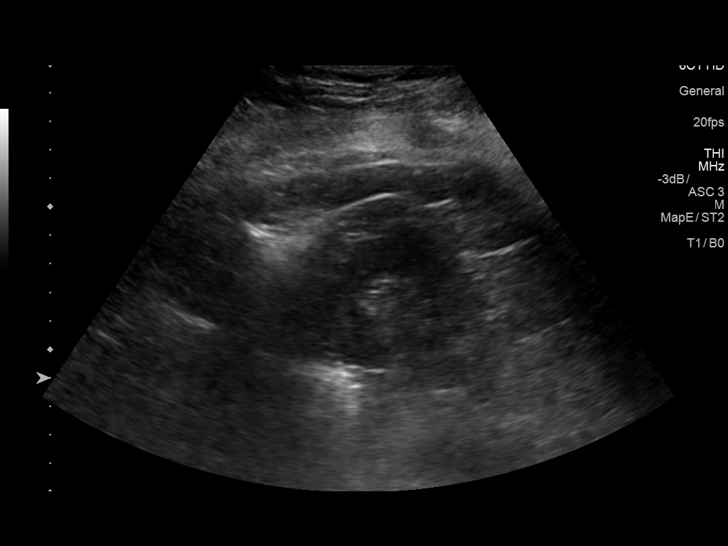
[im 84/84]
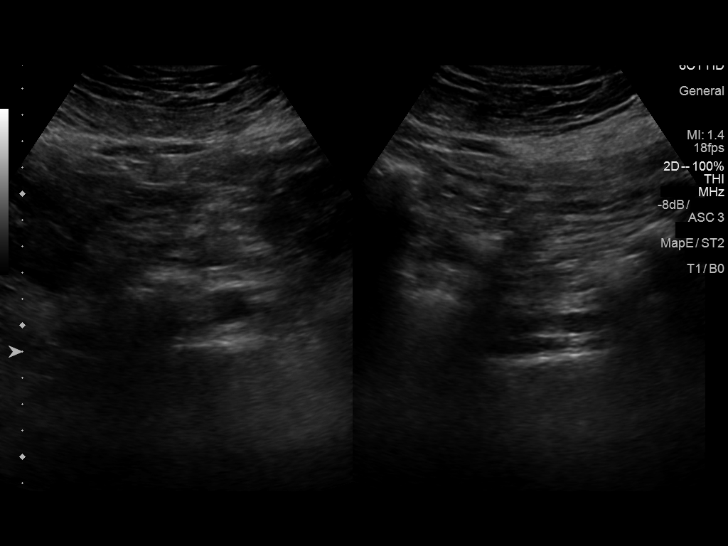

[13 of 25 positions shown; findings below may reference images not displayed]

FINDINGS: Gallbladder: No gallstones or gallbladder wall thickening. No
pericholecystic fluid. The sonographer reports no sonographic
Murphy's sign.

Common bile duct: Diameter: 3 mm

Liver: 10 mm hyperechoic lesion identified in the right liver. No
focal liver lesions seen on previous noncontrast CT. Portal vein is
patent on color Doppler imaging with normal direction of blood flow
towards the liver.

IVC: No abnormality visualized.

Pancreas: Head and body unremarkable.  Tail obscured by bowel gas.

Spleen: Size and appearance within normal limits.

Right Kidney: Length: 10.6 cm. Echogenicity within normal limits. No
mass or hydronephrosis visualized.

Left Kidney: Length: 11.6 cm. Echogenicity within normal limits. No
mass or hydronephrosis visualized.

Abdominal aorta: No aneurysm visualized.

Other findings: None.
IMPRESSION: 1. 10 mm hyperechoic lesion identified in the right liver. This may
be a cavernous hemangioma but cannot be definitively characterized
by ultrasound. MRI without with contrast would be the study of
choice to further evaluate.
2. Otherwise unremarkable abdominal ultrasound.

## 2019-10-09 IMAGING — MR MR ABDOMEN WO/W CM
11 of 17 series · 24 of 48 positions shown · IV contrast (20 ml multihance)
Comparison: 05/09/2017, ultrasound, CT 07/16/2010

CLINICAL DATA: Indeterminate liver lesion on ultrasound. MRI
recommended for further evaluation. Ultrasound performed for RIGHT
upper quadrant pain.

EXAM:
MRI ABDOMEN WITHOUT AND WITH CONTRAST
TECHNIQUE: Multiplanar multisequence MR imaging of the abdomen was performed
both before and after the administration of intravenous contrast.
CONTRAST:  20mL MULTIHANCE GADOBENATE DIMEGLUMINE 529 MG/ML IV SOLN

[Series 4: T2 · coronal · 5.0mm · 1.56mm/px · 1 of 27 slices shown (1 of 3)]
[im 1/27]
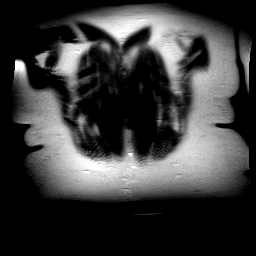

[Series 5: axial tru fisp · axial · 4.0mm · 1.48mm/px · 1 of 41 slices shown]
[im 1/41]
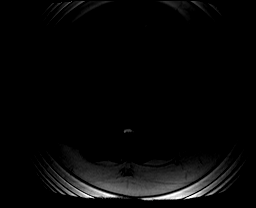

[Series 6: T2 · axial · 6.5mm · 0.74mm/px · 1 of 29 slices shown (2 of 3)]
[im 1/29]
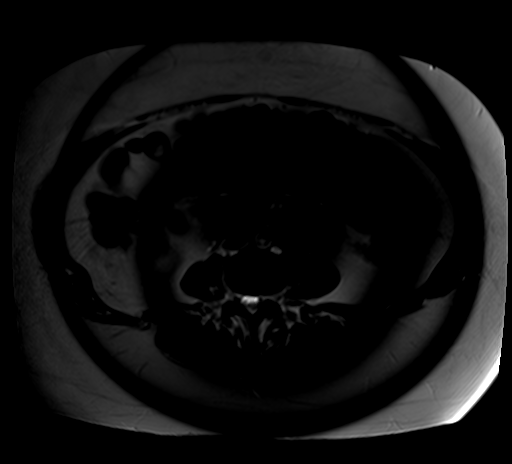

[Series 7: T2 · axial · 4.9mm · 1.37mm/px · 1 of 41 slices shown (3 of 3)]
[im 1/41]
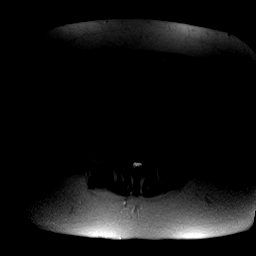

[Series 8: [id number] · axial · 6.0mm · 1.98mm/px · z∈[-14,+235]mm · 3 of 99 slices shown]
[im 1/99]
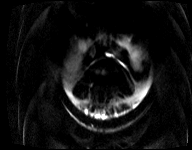
[im 50/99]
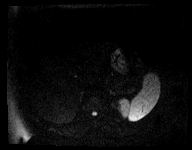
[im 99/99]
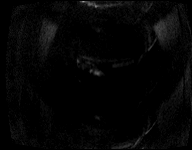

[Series 9: [id number]_adc · axial · 6.0mm · 1.98mm/px · 1 of 33 slices shown]
[im 1/33]
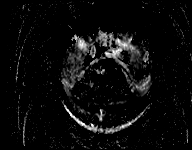

[Series 10: axial in out · axial · 6.0mm · 0.74mm/px · z∈[-2,+219]mm · 2 of 66 slices shown]
[im 1/66]
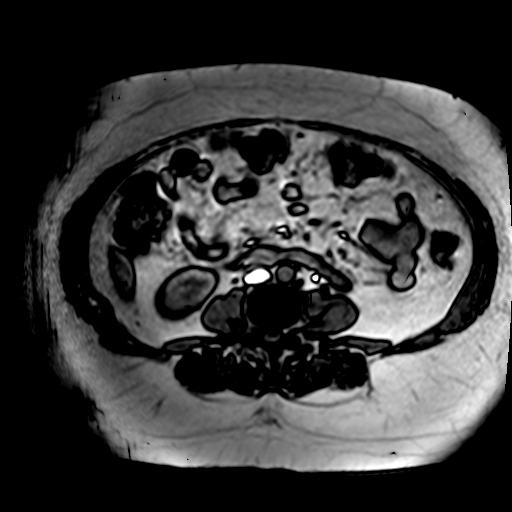
[im 66/66]
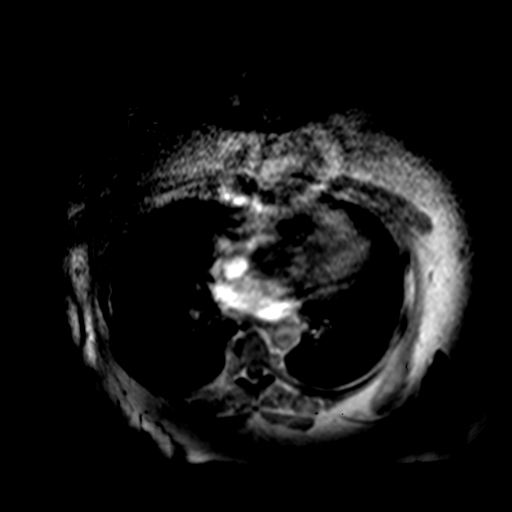

[Series 11: T1 dynamic · axial · non-contrast · 2.0mm · 0.78mm/px · z∈[+8,+230]mm · 4 of 112 slices shown]
[im 1/112]
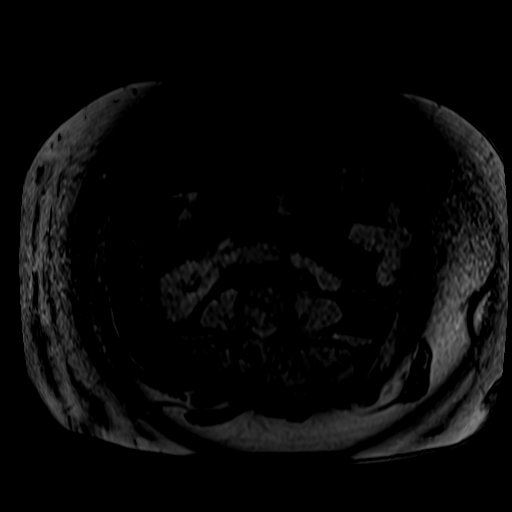
[im 38/112]
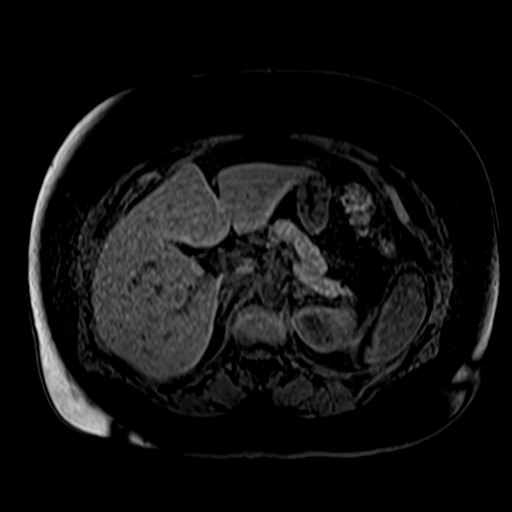
[im 75/112]
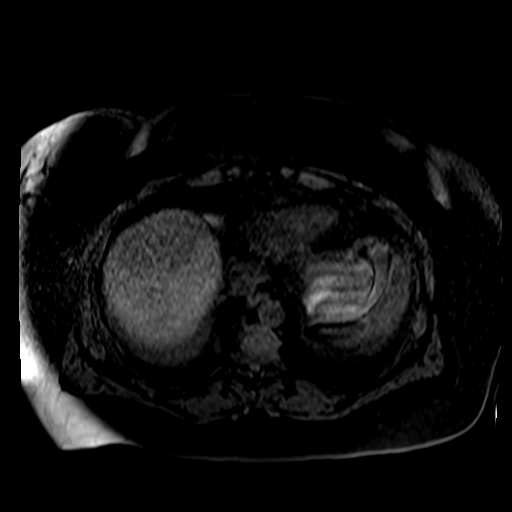
[im 112/112]
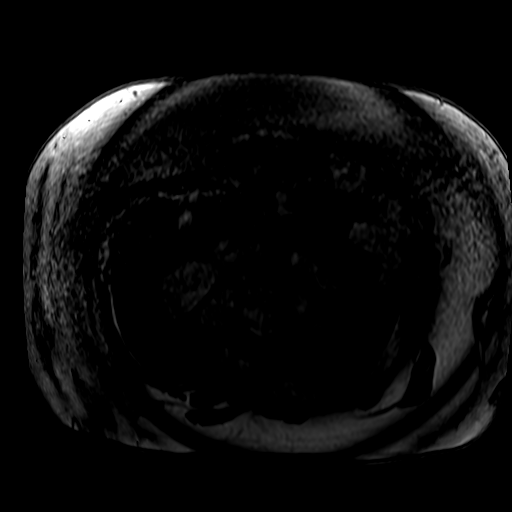

[Series 12: post 25 sec · axial · 2.0mm · 0.78mm/px · z∈[+8,+230]mm · 4 of 112 slices shown]
[im 1/112]
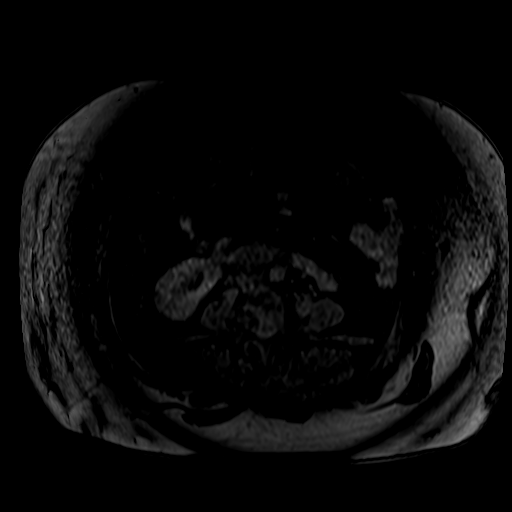
[im 38/112]
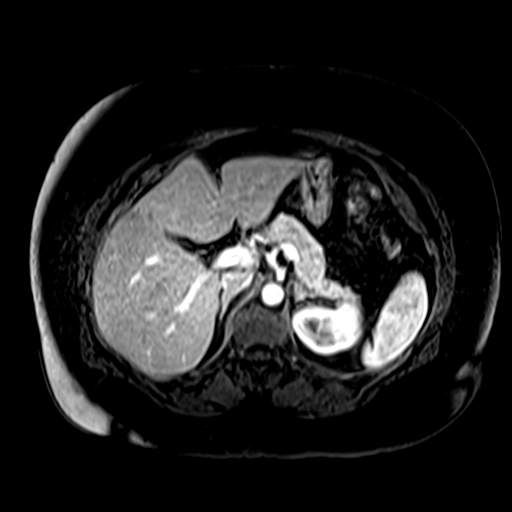
[im 75/112]
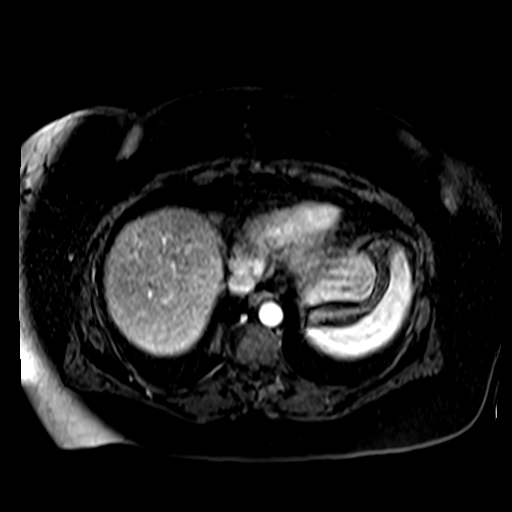
[im 112/112]
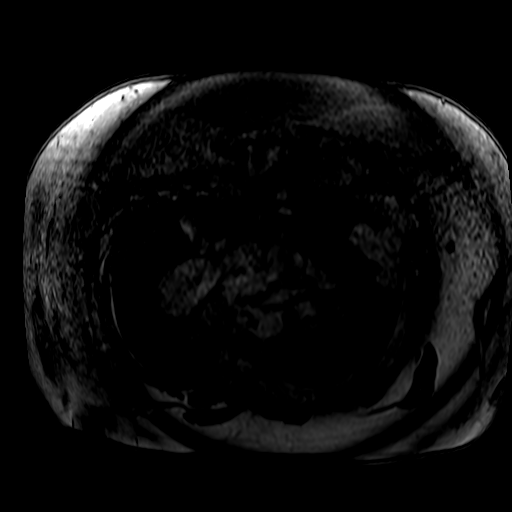

[Series 13: post 25 sec_sub · axial · 2.0mm · 0.78mm/px · z∈[+8,+230]mm · 4 of 112 slices shown]
[im 1/112]
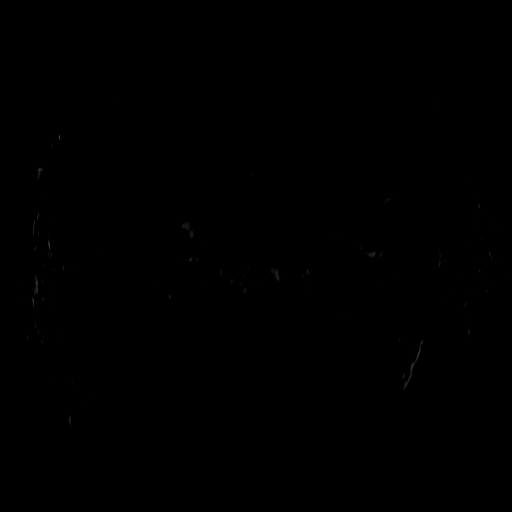
[im 38/112]
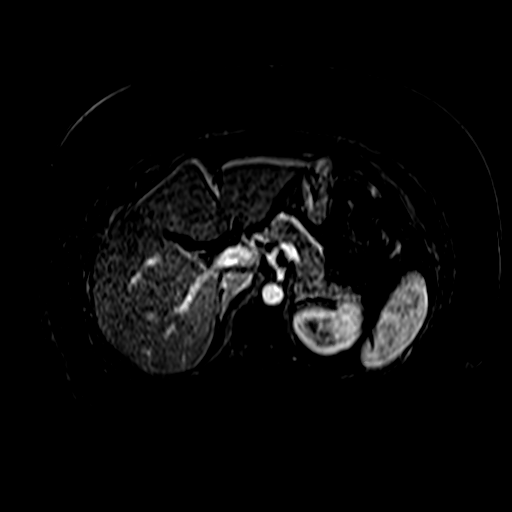
[im 75/112]
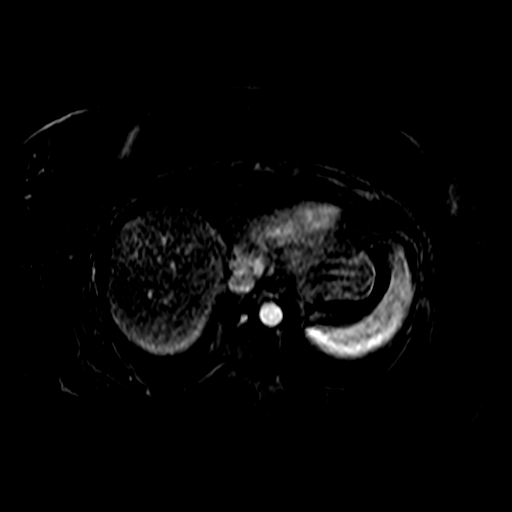
[im 112/112]
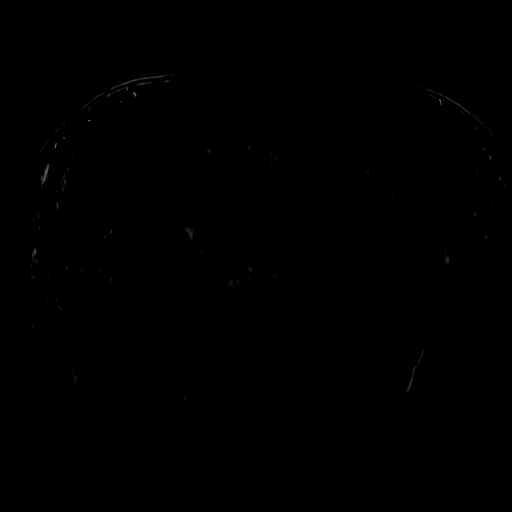

[Series 14: post 45 sec · axial · 2.0mm · 0.78mm/px · z∈[+8,+82]mm · 2 of 112 slices shown]
[im 1/112]
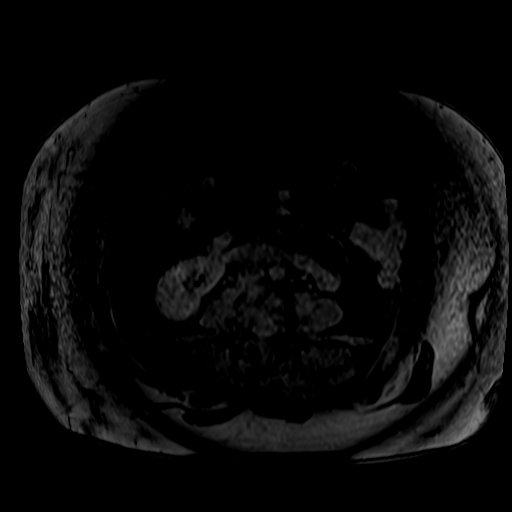
[im 38/112]
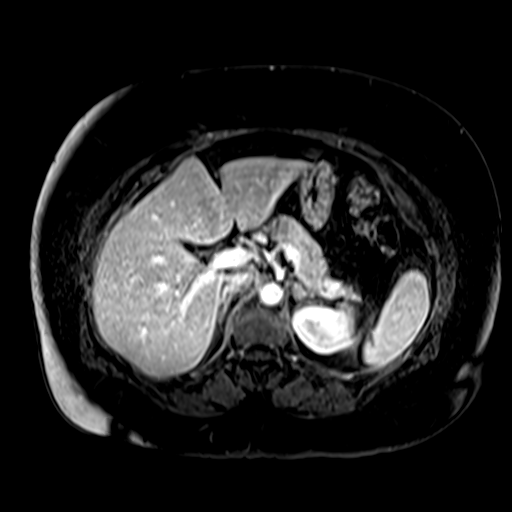

[24 of 48 positions shown; findings below may reference images not displayed]

FINDINGS: Lower chest:  Lung bases are clear.

Hepatobiliary: A tiny lesion in the RIGHT hepatic lobe seen best on
the delayed postcontrast 3 minutes imaging measuring 5 mm (image 66,
series 19). This lesion does not enhance. This lesion is difficult
define on the other sequences. Potentially present on image 18,
series 10. This lesion loses signal on opposed phase imaging
suggesting internal fat. Lesion is high echogenicity on comparison
ultrasound which can be seen with fatty lesions. On remote CT from
07/16/2010 there is a small potential fatty lesion at this site.
Overall findings favor a very small hepatic lipoma.

There is a tiny hypoenhancing lesion in the RIGHT hepatic lobe
measuring 5 mm (image 65, series 9 which appears benign.

Pancreas: Normal pancreatic parenchymal intensity. No ductal
dilatation or inflammation.

Spleen: Normal spleen.

Adrenals/urinary tract: Adrenal glands and kidneys are normal.

Stomach/Bowel: Stomach and limited of the small bowel is
unremarkable

Vascular/Lymphatic: Abdominal aortic normal caliber. No
retroperitoneal periportal lymphadenopathy.

Musculoskeletal: No aggressive osseous lesion
IMPRESSION: 1. Very small presumed hepatic lipoma in the RIGHT hepatic lobe
corresponds to the ultrasound findings. No worrisome hepatic lesion.
2. Normal biliary tree and pancreas

## 2020-01-08 ENCOUNTER — Telehealth: Payer: Self-pay | Admitting: General Practice

## 2020-01-08 NOTE — Telephone Encounter (Signed)
Encounter made in error. 

## 2020-01-21 ENCOUNTER — Ambulatory Visit: Payer: Commercial Managed Care - PPO | Admitting: Family Medicine

## 2021-07-02 IMAGING — CR DG CHEST 2V
2 series · 2 of 2 positions shown · non-contrast
Comparison: 05/11/2016

CLINICAL DATA: Mid chest pain, nausea

EXAM:
CHEST - 2 VIEW

[x chest ap]
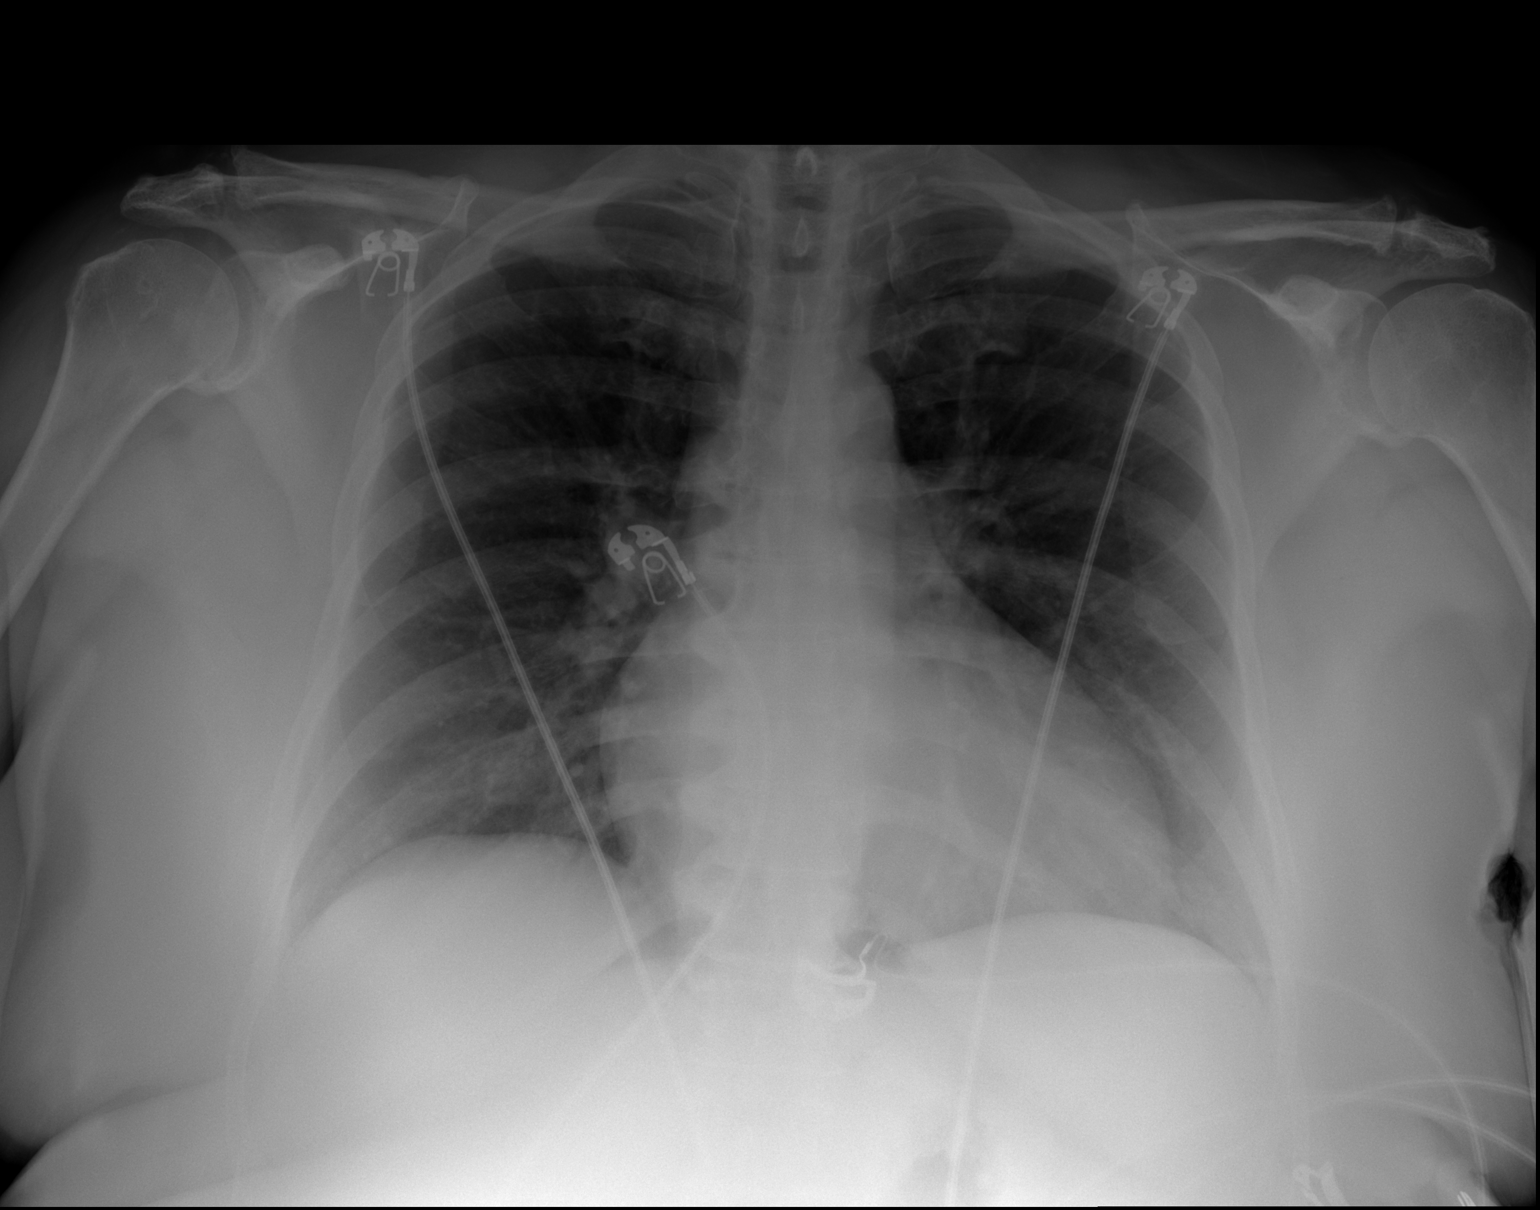

[w chest lat]
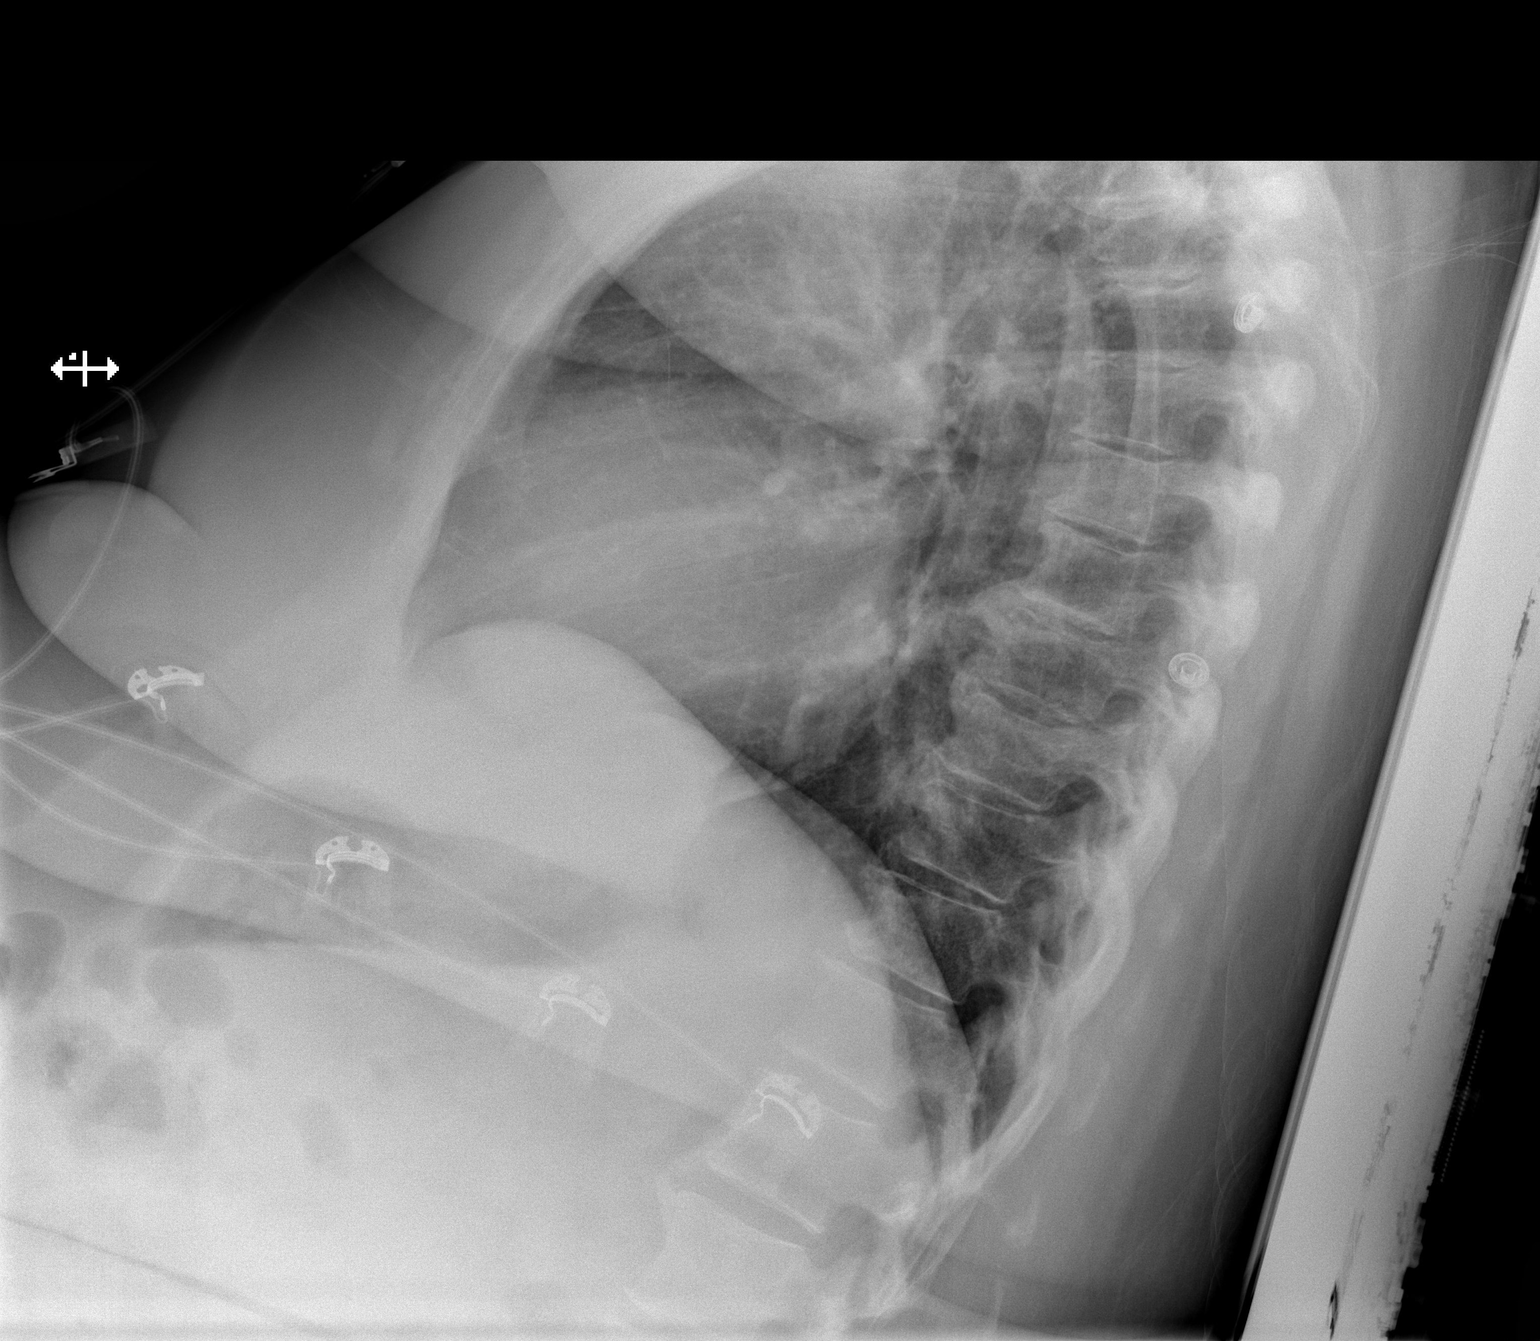

[2 of 2 positions shown; findings below may reference images not displayed]

FINDINGS: The heart size and mediastinal contours are within normal limits.
Both lungs are clear. Degenerative changes within the spine and
shoulders.
IMPRESSION: No active cardiopulmonary disease.
